# Patient Record
Sex: Female | Born: 1965 | Race: White | Hispanic: No | Smoking: Current every day smoker
Health system: Southern US, Community
[De-identification: ages and names within clinical notes are randomized; demographics above are authoritative.]

## PROBLEM LIST (undated history)

## (undated) DIAGNOSIS — J449 Chronic obstructive pulmonary disease, unspecified: Secondary | ICD-10-CM

## (undated) DIAGNOSIS — E079 Disorder of thyroid, unspecified: Secondary | ICD-10-CM

## (undated) DIAGNOSIS — J45909 Unspecified asthma, uncomplicated: Secondary | ICD-10-CM

## (undated) DIAGNOSIS — I10 Essential (primary) hypertension: Secondary | ICD-10-CM

## (undated) DIAGNOSIS — F319 Bipolar disorder, unspecified: Secondary | ICD-10-CM

## (undated) HISTORY — PX: TUBAL LIGATION: SHX77

---

## 2001-01-07 ENCOUNTER — Emergency Department (HOSPITAL_COMMUNITY): Admission: EM | Admit: 2001-01-07 | Discharge: 2001-01-07 | Payer: Self-pay | Admitting: Emergency Medicine

## 2001-01-07 ENCOUNTER — Encounter: Payer: Self-pay | Admitting: Emergency Medicine

## 2003-09-19 ENCOUNTER — Encounter: Payer: Self-pay | Admitting: Emergency Medicine

## 2003-09-19 ENCOUNTER — Inpatient Hospital Stay (HOSPITAL_COMMUNITY): Admission: EM | Admit: 2003-09-19 | Discharge: 2003-09-24 | Payer: Self-pay | Admitting: *Deleted

## 2003-12-15 ENCOUNTER — Emergency Department (HOSPITAL_COMMUNITY): Admission: EM | Admit: 2003-12-15 | Discharge: 2003-12-16 | Payer: Self-pay | Admitting: *Deleted

## 2004-12-03 ENCOUNTER — Emergency Department (HOSPITAL_COMMUNITY): Admission: EM | Admit: 2004-12-03 | Discharge: 2004-12-03 | Payer: Self-pay | Admitting: *Deleted

## 2005-02-25 ENCOUNTER — Emergency Department (HOSPITAL_COMMUNITY): Admission: EM | Admit: 2005-02-25 | Discharge: 2005-02-25 | Payer: Self-pay | Admitting: Emergency Medicine

## 2006-02-12 ENCOUNTER — Emergency Department (HOSPITAL_COMMUNITY): Admission: EM | Admit: 2006-02-12 | Discharge: 2006-02-12 | Payer: Self-pay | Admitting: Emergency Medicine

## 2006-07-25 ENCOUNTER — Emergency Department (HOSPITAL_COMMUNITY): Admission: EM | Admit: 2006-07-25 | Discharge: 2006-07-25 | Payer: Self-pay | Admitting: Emergency Medicine

## 2010-07-12 ENCOUNTER — Emergency Department (HOSPITAL_COMMUNITY): Payer: No Typology Code available for payment source

## 2010-07-12 ENCOUNTER — Encounter (HOSPITAL_COMMUNITY): Payer: Self-pay | Admitting: Radiology

## 2010-07-12 ENCOUNTER — Emergency Department (HOSPITAL_COMMUNITY)
Admission: EM | Admit: 2010-07-12 | Discharge: 2010-07-12 | Disposition: A | Discharge status: Home / Self Care | Payer: No Typology Code available for payment source | Attending: Emergency Medicine | Admitting: Emergency Medicine

## 2010-07-12 DIAGNOSIS — Y92410 Unspecified street and highway as the place of occurrence of the external cause: Secondary | ICD-10-CM | POA: Insufficient documentation

## 2010-07-12 DIAGNOSIS — S20219A Contusion of unspecified front wall of thorax, initial encounter: Secondary | ICD-10-CM | POA: Insufficient documentation

## 2010-07-12 DIAGNOSIS — S139XXA Sprain of joints and ligaments of unspecified parts of neck, initial encounter: Secondary | ICD-10-CM | POA: Insufficient documentation

## 2010-07-12 HISTORY — DX: Essential (primary) hypertension: I10

## 2010-07-24 ENCOUNTER — Emergency Department (HOSPITAL_COMMUNITY)
Admission: EM | Admit: 2010-07-24 | Discharge: 2010-07-24 | Disposition: A | Discharge status: Home / Self Care | Payer: No Typology Code available for payment source | Attending: Emergency Medicine | Admitting: Emergency Medicine

## 2010-07-24 DIAGNOSIS — J45909 Unspecified asthma, uncomplicated: Secondary | ICD-10-CM | POA: Insufficient documentation

## 2010-07-24 DIAGNOSIS — R05 Cough: Secondary | ICD-10-CM | POA: Insufficient documentation

## 2010-07-24 DIAGNOSIS — R059 Cough, unspecified: Secondary | ICD-10-CM | POA: Insufficient documentation

## 2010-08-09 ENCOUNTER — Emergency Department (HOSPITAL_COMMUNITY): Payer: No Typology Code available for payment source

## 2010-08-09 ENCOUNTER — Emergency Department (HOSPITAL_COMMUNITY)
Admission: EM | Admit: 2010-08-09 | Discharge: 2010-08-09 | Disposition: A | Discharge status: Home / Self Care | Payer: No Typology Code available for payment source | Attending: Emergency Medicine | Admitting: Emergency Medicine

## 2010-08-09 DIAGNOSIS — IMO0002 Reserved for concepts with insufficient information to code with codable children: Secondary | ICD-10-CM | POA: Insufficient documentation

## 2010-08-09 DIAGNOSIS — I10 Essential (primary) hypertension: Secondary | ICD-10-CM | POA: Insufficient documentation

## 2010-08-09 DIAGNOSIS — Z79899 Other long term (current) drug therapy: Secondary | ICD-10-CM | POA: Insufficient documentation

## 2010-08-09 DIAGNOSIS — S335XXA Sprain of ligaments of lumbar spine, initial encounter: Secondary | ICD-10-CM | POA: Insufficient documentation

## 2010-08-09 DIAGNOSIS — Y929 Unspecified place or not applicable: Secondary | ICD-10-CM | POA: Insufficient documentation

## 2010-08-09 DIAGNOSIS — R079 Chest pain, unspecified: Secondary | ICD-10-CM | POA: Insufficient documentation

## 2010-08-09 DIAGNOSIS — M542 Cervicalgia: Secondary | ICD-10-CM | POA: Insufficient documentation

## 2010-08-09 DIAGNOSIS — E039 Hypothyroidism, unspecified: Secondary | ICD-10-CM | POA: Insufficient documentation

## 2010-08-09 DIAGNOSIS — S139XXA Sprain of joints and ligaments of unspecified parts of neck, initial encounter: Secondary | ICD-10-CM | POA: Insufficient documentation

## 2010-09-01 ENCOUNTER — Other Ambulatory Visit (HOSPITAL_COMMUNITY): Payer: Self-pay | Admitting: Orthopaedic Surgery

## 2010-09-01 DIAGNOSIS — M545 Low back pain: Secondary | ICD-10-CM

## 2010-09-01 DIAGNOSIS — M546 Pain in thoracic spine: Secondary | ICD-10-CM

## 2010-09-06 ENCOUNTER — Ambulatory Visit (HOSPITAL_COMMUNITY)
Admission: RE | Admit: 2010-09-06 | Discharge: 2010-09-06 | Disposition: A | Discharge status: Home / Self Care | Payer: No Typology Code available for payment source | Source: Ambulatory Visit | Attending: Orthopaedic Surgery | Admitting: Orthopaedic Surgery

## 2010-09-06 DIAGNOSIS — M51379 Other intervertebral disc degeneration, lumbosacral region without mention of lumbar back pain or lower extremity pain: Secondary | ICD-10-CM | POA: Insufficient documentation

## 2010-09-06 DIAGNOSIS — M545 Low back pain, unspecified: Secondary | ICD-10-CM | POA: Insufficient documentation

## 2010-09-06 DIAGNOSIS — M546 Pain in thoracic spine: Secondary | ICD-10-CM

## 2010-09-06 DIAGNOSIS — S339XXA Sprain of unspecified parts of lumbar spine and pelvis, initial encounter: Secondary | ICD-10-CM

## 2010-09-06 DIAGNOSIS — M5137 Other intervertebral disc degeneration, lumbosacral region: Secondary | ICD-10-CM | POA: Insufficient documentation

## 2012-04-11 ENCOUNTER — Emergency Department (HOSPITAL_COMMUNITY)
Admission: EM | Admit: 2012-04-11 | Discharge: 2012-04-11 | Disposition: A | Discharge status: Home / Self Care | Payer: Medicaid Other | Attending: Emergency Medicine | Admitting: Emergency Medicine

## 2012-04-11 ENCOUNTER — Encounter (HOSPITAL_COMMUNITY): Payer: Self-pay | Admitting: *Deleted

## 2012-04-11 DIAGNOSIS — Z79899 Other long term (current) drug therapy: Secondary | ICD-10-CM | POA: Diagnosis not present

## 2012-04-11 DIAGNOSIS — F411 Generalized anxiety disorder: Secondary | ICD-10-CM | POA: Insufficient documentation

## 2012-04-11 DIAGNOSIS — F17200 Nicotine dependence, unspecified, uncomplicated: Secondary | ICD-10-CM | POA: Insufficient documentation

## 2012-04-11 DIAGNOSIS — F319 Bipolar disorder, unspecified: Secondary | ICD-10-CM | POA: Diagnosis not present

## 2012-04-11 DIAGNOSIS — I10 Essential (primary) hypertension: Secondary | ICD-10-CM | POA: Diagnosis not present

## 2012-04-11 DIAGNOSIS — F419 Anxiety disorder, unspecified: Secondary | ICD-10-CM

## 2012-04-11 HISTORY — DX: Bipolar disorder, unspecified: F31.9

## 2012-04-11 MED ORDER — LORAZEPAM 1 MG PO TABS: 1.0000 mg | ORAL_TABLET | Freq: Three times a day (TID) | ORAL | Status: DC | PRN

## 2012-04-11 MED ORDER — LORAZEPAM 2 MG/ML IJ SOLN
1.0000 mg | Freq: Once | INTRAMUSCULAR | Status: AC
  Administered 2012-04-11: 1 mg via INTRAVENOUS
  Filled 2012-04-11: qty 1

## 2012-04-11 NOTE — ED Provider Notes (Signed)
History     CSN: [id number]  Arrival date & time 04/11/12  1704   First MD Initiated Contact with Patient 04/11/12 1736      Chief Complaint  Patient presents with  . Medication Reaction    (Consider location/radiation/quality/duration/timing/severity/associated sxs/prior treatment) Patient is a 46 y.o. female presenting with anxiety. The history is provided by the patient (the pt complains of being axious since starting depakote). No language interpreter was used.  Anxiety This is a new problem. The current episode started more than 2 days ago. The problem occurs constantly. The problem has not changed since onset.Pertinent negatives include no chest pain, no abdominal pain and no headaches. Nothing aggravates the symptoms. Nothing relieves the symptoms.    Past Medical History  Diagnosis Date  . Hypertension   . Bipolar 1 disorder     Past Surgical History  Procedure Date  . Tubal ligation   . Cesarean section     No family history on file.  History  Substance Use Topics  . Smoking status: Current Every Day Smoker    Types: Cigarettes  . Smokeless tobacco: Not on file  . Alcohol Use: Yes     Comment: occ    OB History    Grav Para Term Preterm Abortions TAB SAB Ect Mult Living                  Review of Systems  Constitutional: Negative for fatigue.  HENT: Negative for congestion, sinus pressure and ear discharge.   Eyes: Negative for discharge.  Respiratory: Negative for cough.   Cardiovascular: Negative for chest pain.  Gastrointestinal: Negative for abdominal pain and diarrhea.  Genitourinary: Negative for frequency and hematuria.  Musculoskeletal: Negative for back pain.  Skin: Negative for rash.  Neurological: Negative for seizures and headaches.  Hematological: Negative.   Psychiatric/Behavioral: Positive for agitation. Negative for hallucinations.    Allergies  Penicillins and Sulfa antibiotics  Home Medications   Current Outpatient Rx    Name  Route  Sig  Dispense  Refill  . ALBUTEROL SULFATE HFA 108 (90 BASE) MCG/ACT IN AERS   Inhalation   Inhale 2 puffs into the lungs every 6 (six) hours as needed. For shortness of breath         . VITAMIN B-12 5000 MCG SL SUBL   Sublingual   Place 1 tablet under the tongue daily.         Marland Kitchen DIVALPROEX SODIUM 500 MG PO TBEC   Oral   Take 2,250 mg by mouth 2 (two) times daily.         Marland Kitchen LEVOTHYROXINE SODIUM 125 MCG PO TABS   Oral   Take 125 mcg by mouth daily.         Marland Kitchen LISINOPRIL-HYDROCHLOROTHIAZIDE 20-25 MG PO TABS   Oral   Take 1 tablet by mouth daily.         Marygrace Drought WOMENS FORMULA PO TABS   Oral   Take 1 tablet by mouth daily.         Marland Kitchen RANITIDINE HCL 150 MG PO TABS   Oral   Take 150 mg by mouth 2 (two) times daily.         Marland Kitchen LORAZEPAM 1 MG PO TABS   Oral   Take 1 tablet (1 mg total) by mouth 3 (three) times daily as needed for anxiety.   15 tablet   0     BP 141/69  Pulse 110  Temp 98.1 F (36.7  C)  Resp 20  Ht 5' (1.524 m)  Wt 120 lb (54.432 kg)  BMI 23.44 kg/m2  SpO2 100%  LMP 07/05/2010  Physical Exam  Constitutional: She is oriented to person, place, and time. She appears well-developed.  HENT:  Head: Normocephalic and atraumatic.  Eyes: Conjunctivae normal and EOM are normal. No scleral icterus.  Neck: Neck supple. No thyromegaly present.  Cardiovascular: Normal rate and regular rhythm.  Exam reveals no gallop and no friction rub.   No murmur heard. Pulmonary/Chest: No stridor. She has no wheezes. She has no rales. She exhibits no tenderness.  Abdominal: She exhibits no distension. There is no tenderness. There is no rebound.  Musculoskeletal: Normal range of motion. She exhibits no edema.  Lymphadenopathy:    She has no cervical adenopathy.  Neurological: She is oriented to person, place, and time. Coordination normal.  Skin: No rash noted. No erythema.  Psychiatric:       Pt anxious    ED Course  Procedures  (including critical care time)  Labs Reviewed - No data to display No results found.   1. Anxiety       MDM  The chart was scribed for me under my direct supervision.  I personally performed the history, physical, and medical decision making and all procedures in the evaluation of this patient.Benny Lennert, MD 04/11/12 2016

## 2012-04-11 NOTE — ED Notes (Signed)
Recently had prozac and depakote doses adjusted.  States, "I just can't slow down.  I feel like I'm about to lose it".  States feeling this way x 3 days.
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
## 2012-04-11 NOTE — ED Notes (Signed)
Pt denies any SI/HI thoughts at this time. 

## 2012-04-11 NOTE — ED Notes (Signed)
Pt states she has been drinking energy drinks

## 2012-04-13 ENCOUNTER — Encounter (HOSPITAL_COMMUNITY): Payer: Self-pay | Admitting: Emergency Medicine

## 2012-04-13 ENCOUNTER — Emergency Department (HOSPITAL_COMMUNITY)
Admission: EM | Admit: 2012-04-13 | Discharge: 2012-04-13 | Disposition: A | Discharge status: Home / Self Care | Payer: 59 | Attending: Emergency Medicine | Admitting: Emergency Medicine

## 2012-04-13 DIAGNOSIS — I10 Essential (primary) hypertension: Secondary | ICD-10-CM | POA: Diagnosis not present

## 2012-04-13 DIAGNOSIS — Z79899 Other long term (current) drug therapy: Secondary | ICD-10-CM | POA: Insufficient documentation

## 2012-04-13 DIAGNOSIS — F319 Bipolar disorder, unspecified: Secondary | ICD-10-CM | POA: Insufficient documentation

## 2012-04-13 DIAGNOSIS — F411 Generalized anxiety disorder: Secondary | ICD-10-CM | POA: Diagnosis present

## 2012-04-13 DIAGNOSIS — F419 Anxiety disorder, unspecified: Secondary | ICD-10-CM

## 2012-04-13 LAB — CBC
Platelets: 208 10*3/uL (ref 150–400)
RDW: 14.1 % (ref 11.5–15.5)
WBC: 6 10*3/uL (ref 4.0–10.5)

## 2012-04-13 LAB — RAPID URINE DRUG SCREEN, HOSP PERFORMED
Barbiturates: NOT DETECTED
Cocaine: NOT DETECTED
Opiates: NOT DETECTED

## 2012-04-13 LAB — BASIC METABOLIC PANEL
Chloride: 94 mEq/L — ABNORMAL LOW (ref 96–112)
GFR calc Af Amer: >90 mL/min (ref >90)
Potassium: 4.2 mEq/L (ref 3.5–5.1)
Sodium: 135 mEq/L (ref 135–145)

## 2012-04-13 LAB — URINALYSIS, ROUTINE W REFLEX MICROSCOPIC
Bilirubin Urine: NEGATIVE
Hgb urine dipstick: NEGATIVE
Nitrite: NEGATIVE
Specific Gravity, Urine: <1.005 — ABNORMAL LOW (ref 1.005–1.030)
pH: 6 (ref 5.0–8.0)

## 2012-04-13 LAB — PREGNANCY, URINE: Preg Test, Ur: NEGATIVE

## 2012-04-13 MED ORDER — LORAZEPAM 2 MG/ML IJ SOLN: 1.0000 mg | Freq: Once | INTRAMUSCULAR | Status: DC

## 2012-04-13 NOTE — ED Notes (Signed)
Act team will be contacted in AM, no ACT team member is on call at night at [facility]
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
## 2012-04-13 NOTE — ED Notes (Signed)
Spoke with Ella From ACT team, states she will see the patient this morning  
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
## 2012-04-13 NOTE — ED Notes (Signed)
Pt eval by ACT and is being discharged home
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
## 2012-06-05 ENCOUNTER — Inpatient Hospital Stay (HOSPITAL_COMMUNITY)
Admission: EM | Admit: 2012-06-05 | Discharge: 2012-06-06 | DRG: 192 | Payer: Medicaid Other | Attending: Internal Medicine | Admitting: Internal Medicine

## 2012-06-05 ENCOUNTER — Emergency Department (HOSPITAL_COMMUNITY): Payer: Medicaid Other

## 2012-06-05 ENCOUNTER — Encounter (HOSPITAL_COMMUNITY): Payer: Self-pay | Admitting: *Deleted

## 2012-06-05 DIAGNOSIS — G4700 Insomnia, unspecified: Secondary | ICD-10-CM | POA: Diagnosis present

## 2012-06-05 DIAGNOSIS — Z880 Allergy status to penicillin: Secondary | ICD-10-CM | POA: Diagnosis not present

## 2012-06-05 DIAGNOSIS — F411 Generalized anxiety disorder: Secondary | ICD-10-CM | POA: Diagnosis present

## 2012-06-05 DIAGNOSIS — I10 Essential (primary) hypertension: Secondary | ICD-10-CM | POA: Diagnosis present

## 2012-06-05 DIAGNOSIS — J441 Chronic obstructive pulmonary disease with (acute) exacerbation: Principal | ICD-10-CM | POA: Diagnosis present

## 2012-06-05 DIAGNOSIS — Z882 Allergy status to sulfonamides status: Secondary | ICD-10-CM | POA: Diagnosis not present

## 2012-06-05 DIAGNOSIS — J45901 Unspecified asthma with (acute) exacerbation: Secondary | ICD-10-CM | POA: Diagnosis present

## 2012-06-05 DIAGNOSIS — F17200 Nicotine dependence, unspecified, uncomplicated: Secondary | ICD-10-CM | POA: Diagnosis present

## 2012-06-05 DIAGNOSIS — Z720 Tobacco use: Secondary | ICD-10-CM

## 2012-06-05 DIAGNOSIS — R0602 Shortness of breath: Secondary | ICD-10-CM | POA: Diagnosis present

## 2012-06-05 DIAGNOSIS — Z79899 Other long term (current) drug therapy: Secondary | ICD-10-CM

## 2012-06-05 DIAGNOSIS — Z7982 Long term (current) use of aspirin: Secondary | ICD-10-CM

## 2012-06-05 DIAGNOSIS — F319 Bipolar disorder, unspecified: Secondary | ICD-10-CM | POA: Diagnosis present

## 2012-06-05 HISTORY — DX: Unspecified asthma, uncomplicated: J45.909

## 2012-06-05 HISTORY — DX: Chronic obstructive pulmonary disease, unspecified: J44.9

## 2012-06-05 LAB — BASIC METABOLIC PANEL
CO2: 28 mEq/L (ref 19–32)
Calcium: 9.7 mg/dL (ref 8.4–10.5)
GFR calc non Af Amer: >90 mL/min (ref >90)
Glucose, Bld: 113 mg/dL — ABNORMAL HIGH (ref 70–99)
Potassium: 4.9 mEq/L (ref 3.5–5.1)
Sodium: 137 mEq/L (ref 135–145)

## 2012-06-05 LAB — CBC WITH DIFFERENTIAL/PLATELET
Hemoglobin: 11.8 g/dL — ABNORMAL LOW (ref 12.0–15.0)
Lymphocytes Relative: 8 % — ABNORMAL LOW (ref 12–46)
Lymphs Abs: 0.8 10*3/uL (ref 0.7–4.0)
Monocytes Relative: 6 % (ref 3–12)
Neutrophils Relative %: 83 % — ABNORMAL HIGH (ref 43–77)
Platelets: 244 10*3/uL (ref 150–400)
RBC: 3.72 MIL/uL — ABNORMAL LOW (ref 3.87–5.11)
WBC: 9.8 10*3/uL (ref 4.0–10.5)

## 2012-06-05 LAB — RAPID STREP SCREEN (MED CTR MEBANE ONLY): Streptococcus, Group A Screen (Direct): NEGATIVE

## 2012-06-05 MED ORDER — QUETIAPINE FUMARATE ER 50 MG PO TB24
150.0000 mg | ORAL_TABLET | Freq: Every day | ORAL | Status: DC
  Administered 2012-06-06: 150 mg via ORAL
  Filled 2012-06-05 (×2): qty 3

## 2012-06-05 MED ORDER — ALBUTEROL SULFATE (5 MG/ML) 0.5% IN NEBU
15.0000 mg | INHALATION_SOLUTION | Freq: Once | RESPIRATORY_TRACT | Status: AC
  Administered 2012-06-05: 15 mg via RESPIRATORY_TRACT
  Filled 2012-06-05 (×2): qty 3

## 2012-06-05 MED ORDER — ACETAMINOPHEN 325 MG PO TABS: 650.0000 mg | ORAL_TABLET | Freq: Four times a day (QID) | ORAL | Status: DC | PRN

## 2012-06-05 MED ORDER — LEVOTHYROXINE SODIUM 25 MCG PO TABS: 125.0000 ug | ORAL_TABLET | Freq: Every day | ORAL | Status: DC

## 2012-06-05 MED ORDER — ALBUTEROL SULFATE (5 MG/ML) 0.5% IN NEBU: 2.5000 mg | INHALATION_SOLUTION | RESPIRATORY_TRACT | Status: DC | PRN

## 2012-06-05 MED ORDER — IPRATROPIUM BROMIDE 0.02 % IN SOLN
1.0000 mg | Freq: Once | RESPIRATORY_TRACT | Status: AC
  Administered 2012-06-05: 1 mg via RESPIRATORY_TRACT
  Filled 2012-06-05: qty 5

## 2012-06-05 MED ORDER — ACETAMINOPHEN 650 MG RE SUPP: 650.0000 mg | Freq: Four times a day (QID) | RECTAL | Status: DC | PRN

## 2012-06-05 MED ORDER — ALBUTEROL SULFATE (5 MG/ML) 0.5% IN NEBU
2.5000 mg | INHALATION_SOLUTION | Freq: Four times a day (QID) | RESPIRATORY_TRACT | Status: DC
  Administered 2012-06-06 (×2): 2.5 mg via RESPIRATORY_TRACT
  Filled 2012-06-05 (×2): qty 0.5

## 2012-06-05 MED ORDER — TRAZODONE HCL 50 MG PO TABS: 25.0000 mg | ORAL_TABLET | Freq: Every evening | ORAL | Status: DC | PRN

## 2012-06-05 MED ORDER — LEVOFLOXACIN IN D5W 500 MG/100ML IV SOLN
500.0000 mg | Freq: Once | INTRAVENOUS | Status: AC
  Administered 2012-06-05: 500 mg via INTRAVENOUS
  Filled 2012-06-05: qty 100

## 2012-06-05 MED ORDER — LEVOFLOXACIN IN D5W 500 MG/100ML IV SOLN
500.0000 mg | INTRAVENOUS | Status: DC
  Filled 2012-06-05: qty 100

## 2012-06-05 MED ORDER — METHYLPREDNISOLONE SODIUM SUCC 125 MG IJ SOLR
60.0000 mg | Freq: Two times a day (BID) | INTRAMUSCULAR | Status: DC
  Administered 2012-06-06 (×2): 60 mg via INTRAVENOUS
  Filled 2012-06-05 (×2): qty 2

## 2012-06-05 MED ORDER — ALUM & MAG HYDROXIDE-SIMETH 200-200-20 MG/5ML PO SUSP
30.0000 mL | Freq: Four times a day (QID) | ORAL | Status: DC | PRN
Start: 2012-06-05 — End: 2012-06-06

## 2012-06-05 MED ORDER — ONDANSETRON HCL 4 MG/2ML IJ SOLN: 4.0000 mg | Freq: Four times a day (QID) | INTRAMUSCULAR | Status: DC | PRN

## 2012-06-05 MED ORDER — GABAPENTIN 300 MG PO CAPS
300.0000 mg | ORAL_CAPSULE | Freq: Three times a day (TID) | ORAL | Status: DC
  Administered 2012-06-06 (×2): 300 mg via ORAL
  Filled 2012-06-05 (×2): qty 1

## 2012-06-05 MED ORDER — SODIUM CHLORIDE 0.9 % IV SOLN: INTRAVENOUS | Status: DC

## 2012-06-05 MED ORDER — PREDNISONE 50 MG PO TABS
60.0000 mg | ORAL_TABLET | Freq: Once | ORAL | Status: AC
  Administered 2012-06-05: 60 mg via ORAL
  Filled 2012-06-05: qty 1

## 2012-06-05 MED ORDER — ONDANSETRON HCL 4 MG PO TABS: 4.0000 mg | ORAL_TABLET | Freq: Four times a day (QID) | ORAL | Status: DC | PRN

## 2012-06-05 MED ORDER — IPRATROPIUM BROMIDE 0.02 % IN SOLN
0.5000 mg | Freq: Four times a day (QID) | RESPIRATORY_TRACT | Status: DC
  Administered 2012-06-06 (×2): 0.5 mg via RESPIRATORY_TRACT
  Filled 2012-06-05 (×2): qty 2.5

## 2012-06-05 MED ORDER — HYPROMELLOSE (GONIOSCOPIC) 2.5 % OP SOLN
1.0000 [drp] | Freq: Three times a day (TID) | OPHTHALMIC | Status: DC | PRN
  Filled 2012-06-05: qty 15

## 2012-06-05 MED ORDER — SODIUM CHLORIDE 0.9 % IV SOLN
INTRAVENOUS | Status: DC
  Administered 2012-06-05: 23:00:00 via INTRAVENOUS

## 2012-06-05 MED ORDER — GUAIFENESIN-DM 100-10 MG/5ML PO SYRP: 5.0000 mL | ORAL_SOLUTION | ORAL | Status: DC | PRN

## 2012-06-05 MED ORDER — LISINOPRIL 10 MG PO TABS
10.0000 mg | ORAL_TABLET | Freq: Every day | ORAL | Status: DC
  Administered 2012-06-06: 10 mg via ORAL
  Filled 2012-06-05: qty 1

## 2012-06-05 NOTE — ED Notes (Signed)
O2 sats increased to 96

## 2012-06-05 NOTE — ED Notes (Signed)
Oxygen sats will not stay above 90 on room air. [place] 2 LPM placed. EDP notified. BBS course with wheezing throughout.  Pt has a productive cough of yellow/green.

## 2012-06-05 NOTE — ED Provider Notes (Signed)
History     CSN: [id number]  Arrival date & time 06/05/12  Margaret Sheppard   First MD Initiated Contact with Patient 06/05/12 2023      Chief Complaint  Patient presents with  . Shortness of Breath     HPI Pt was seen at 2040.   Per pt, c/o gradual onset and worsening of persistence SOB, moist cough, and "wheezing" for the past 4 days.  Has been associated with runny/stuffy nose, sore throat, generalized body aches and fatigue.  Describes her cough as productive of "yellow" sputum. States her symptoms began after she ran out of her MDI 4 days ago.  Denies CP/palpitations, no back pain, no abd pain, no N/V/D, no fevers.     PMD:  Health Dept Past Medical History  Diagnosis Date  . Hypertension   . Bipolar 1 disorder   . Asthma   . COPD (chronic obstructive pulmonary disease)     Past Surgical History  Procedure Date  . Tubal ligation   . Cesarean section     History  Substance Use Topics  . Smoking status: Current Every Day Smoker    Types: Cigarettes  . Smokeless tobacco: Not on file  . Alcohol Use: No     Comment: occ    Review of Systems ROS: Statement: All systems negative except as marked or noted in the HPI; Constitutional: Negative for fever and chills. ; ; Eyes: Negative for eye pain, redness and discharge. ; ; ENMT: Negative for ear pain, hoarseness, +nasal congestion, sinus pressure and sore throat. ; ; Cardiovascular: Negative for chest pain, palpitations, diaphoresis, and peripheral edema. ; ; Respiratory: +cough, wheezing, SOB. Negative for stridor. ; ; Gastrointestinal: Negative for nausea, vomiting, diarrhea, abdominal pain, blood in stool, hematemesis, jaundice and rectal bleeding. . ; ; Genitourinary: Negative for dysuria, flank pain and hematuria. ; ; Musculoskeletal: Negative for back pain and neck pain. Negative for swelling and trauma.; ; Skin: Negative for pruritus, rash, abrasions, blisters, bruising and skin lesion.; ; Neuro: Negative for headache,  lightheadedness and neck stiffness. Negative for weakness, altered level of consciousness , altered mental status, extremity weakness, paresthesias, involuntary movement, seizure and syncope.       Allergies  Sulfa antibiotics and Penicillins  Home Medications   Current Outpatient Rx  Name  Route  Sig  Dispense  Refill  . ALBUTEROL SULFATE HFA 108 (90 BASE) MCG/ACT IN AERS   Inhalation   Inhale 2 puffs into the lungs every 6 (six) hours as needed. For shortness of breath         . ASPIRIN 325 MG PO TABS   Oral   Take 650 mg by mouth once as needed. For fever         . ASPIRIN-SALICYLAMIDE-CAFFEINE 325-95-16 MG PO TABS   Oral   Take 1 packet by mouth once as needed. For fever         . VITAMIN B-12 5000 MCG SL SUBL   Sublingual   Place 1 tablet under the tongue daily.         Marland Kitchen GABAPENTIN 300 MG PO CAPS   Oral   Take 300 mg by mouth 3 (three) times daily as needed.         Marland Kitchen HYPROMELLOSE 2.5 % OP SOLN   Both Eyes   Place 1 drop into both eyes 3 (three) times daily as needed. For dry eyes/contacts         . LEVOTHYROXINE SODIUM 125 MCG PO TABS  Oral   Take 125 mcg by mouth daily.         Marland Kitchen LISINOPRIL 10 MG PO TABS   Oral   Take 10 mg by mouth daily.         Marland Kitchen PSEUDOEPHEDRINE-GUAIFENESIN ER 60-600 MG PO TB12   Oral   Take 2 tablets by mouth daily as needed. For congestion         . QUETIAPINE FUMARATE ER 150 MG PO TB24   Oral   Take 150 mg by mouth at bedtime.           BP 103/60  Pulse 104  Temp 97.9 F (36.6 C) (Oral)  Resp 24  Ht 5' (1.524 m)  Wt 118 lb (53.524 kg)  BMI 23.05 kg/m2  SpO2 92%  LMP 07/05/2010  Physical Exam 2045: Physical examination:  Nursing notes reviewed; Vital signs and O2 SAT reviewed;  Constitutional: Well developed, Well nourished, Well hydrated, Uncomfortable appearing; Head:  Normocephalic, atraumatic; Eyes: EOMI, PERRL, No scleral icterus; ENMT: Mouth and pharynx normal, Mucous membranes moist; Neck:  Supple, Full range of motion, No lymphadenopathy; Cardiovascular: Tachycardic rate and rhythm, No gallop; Respiratory: Breath sounds diminished & equal bilaterally, faint scattered wheezes.  Speaking words, sitting upright, +access mm use. Tachypneic.; Chest: Nontender, Movement normal; Abdomen: Soft, Nontender, Nondistended, Normal bowel sounds; Genitourinary: No CVA tenderness; Extremities: Pulses normal, No tenderness, No edema, No calf edema or asymmetry.; Neuro: AA&Ox3, Major CN grossly intact.  Speech clear. No gross focal motor or sensory deficits in extremities.; Skin: Color normal, Warm, Dry.   ED Course  Procedures     MDM  MDM Reviewed: previous chart, nursing note and vitals Interpretation: x-ray and labs Total time providing critical care: 30-74 minutes. This excludes time spent performing separately reportable procedures and services. Consults: admitting MD   CRITICAL CARE Performed by: Laray Anger Total critical care time: 40 Critical care time was exclusive of separately billable procedures and treating other patients. Critical care was necessary to treat or prevent imminent or life-threatening deterioration. Critical care was time spent personally by me on the following activities: development of treatment plan with patient and/or surrogate as well as nursing, discussions with consultants, evaluation of patient's response to treatment, examination of patient, obtaining history from patient or surrogate, ordering and performing treatments and interventions, ordering and review of laboratory studies, ordering and review of radiographic studies, pulse oximetry and re-evaluation of patient's condition.    Results for orders placed during the hospital encounter of 06/05/12  RAPID STREP SCREEN      Component Value Range   Streptococcus, Group A Screen (Direct) NEGATIVE  NEGATIVE   Dg Chest 2 View 06/05/2012  *RADIOLOGY REPORT*  Clinical Data: Shortness of breath.  Cough  and fever.  CHEST - 2 VIEW  Comparison: None available.  Findings: The heart size is normal.  The lungs are clear.  Mild emphysematous changes are noted.  The visualized soft tissues and bony thorax are unremarkable.  IMPRESSION: 1.  Mild emphysematous change. 2.  No acute cardiopulmonary disease.   Original Report Authenticated By: Marin Roberts, M.D.      2230:  Pt completed hour long neb, steroids given.  Pt continues to c/o SOB but feels somewhat improved from arrival to ED, Sats 88-89% R/A. Lungs with insp/exp wheezing bilat, no audible wheezing. Speaking in short sentences. Able to lean back on stretcher now.  Sats increase to 96% when O2 N/C applied. Will dose IV levaquin for acute exacerbation COPD and admit.  Dx and testing d/w pt.  Questions answered.  Verb understanding, agreeable to admit.  2240:  T/C to [facility]. Phillips Odor, case discussed, including:  HPI, pertinent PM/SHx, VS/PE, dx testing, ED course and treatment:  Agreeable to admit, requests she will come to ED for eval.        Laray Anger, DO 06/06/12 1719

## 2012-06-05 NOTE — ED Notes (Signed)
Cough, sore throat, sob, yellow green sputum.  Fever.  No vomiting.

## 2012-06-05 NOTE — ED Notes (Signed)
Pt presents with SOB,sore throat, cough and nasal congestion x 4 days. Pt reports extensive history of COPD, emphysemia and asthma. No albuterol use at home secondary to ran out of medication.

## 2012-06-06 DIAGNOSIS — I10 Essential (primary) hypertension: Secondary | ICD-10-CM

## 2012-06-06 DIAGNOSIS — F319 Bipolar disorder, unspecified: Secondary | ICD-10-CM

## 2012-06-06 DIAGNOSIS — F17200 Nicotine dependence, unspecified, uncomplicated: Secondary | ICD-10-CM

## 2012-06-06 MED ORDER — PREDNISONE 20 MG PO TABS: ORAL_TABLET | ORAL | Status: DC

## 2012-06-06 MED ORDER — LEVOFLOXACIN 500 MG PO TABS: 500.0000 mg | ORAL_TABLET | Freq: Every day | ORAL | Status: AC

## 2012-06-06 MED ORDER — LEVOTHYROXINE SODIUM 112 MCG PO TABS: 112.0000 ug | ORAL_TABLET | Freq: Every day | ORAL | Status: DC

## 2012-06-06 MED ORDER — POLYVINYL ALCOHOL 1.4 % OP SOLN: 1.0000 [drp] | Freq: Three times a day (TID) | OPHTHALMIC | Status: DC | PRN

## 2012-06-06 MED ORDER — PNEUMOCOCCAL VAC POLYVALENT 25 MCG/0.5ML IJ INJ
0.5000 mL | INJECTION | INTRAMUSCULAR | Status: DC
  Filled 2012-06-06: qty 0.5

## 2012-06-06 MED ORDER — INFLUENZA VIRUS VACC SPLIT PF IM SUSP
0.5000 mL | INTRAMUSCULAR | Status: DC
  Filled 2012-06-06: qty 0.5
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
## 2012-06-06 NOTE — Care Management Note (Signed)
    Page 1 of 1   06/06/2012     3:17:33 PM   CARE MANAGEMENT NOTE 06/06/2012  Patient:  Margaret Sheppard, Margaret Sheppard   Account Number:  [id number]  Date Initiated:  06/06/2012  Documentation initiated by:  Rosemary Holms  Subjective/Objective Assessment:   [facility]. DC'd back to same. MATCH program done.     Action/Plan:   Anticipated DC Date:  06/06/2012   Anticipated DC Plan:  HOME/SELF CARE      DC Planning Services  CM consult  MATCH Program      Choice offered to / List presented to:             Status of service:  Completed, signed off Medicare Important Message given?   (If response is "NO", the following Medicare IM given date fields will be blank) Date Medicare IM given:   Date Additional Medicare IM given:    Discharge Disposition:  HOME/SELF CARE  Per UR Regulation:    If discussed at Long Length of Stay Meetings, dates discussed:    Comments:  06/06/12 Rosemary Holms RN BSN CM

## 2012-06-06 NOTE — Progress Notes (Signed)
UR Chart Review Completed  

## 2012-06-06 NOTE — H&P (Signed)
[facility] History and Physical  Margaret Sheppard ZOX:[id number] DOB: 10-Apr-1966 DOA: 06/05/2012  Referring physician: Weber Cooks PCP: No primary provider on file.  Specialists: none  Chief Complaint: Dyspnea  HPI: Margaret Sheppard is a 47 y.o. female a past medical history significant for Asthma/COPD and ongoing tobacco abuse who presented to the emergency department with a 2 to 3 day history of increasing shortness of breath associated with a sore throat, nasal congestion and cough. No fevers, she has had myalgias. She does not currently have a primary care provider or health insurance. She ran out of her albuterol inhaler which she had been getting from the health department. On arrival to the emergency department her oxygen saturations were in the mid 80s, these improved to the mid 90s with continuous inhaled albuterol as well as supplemental nasal cannula oxygen her chest x-ray did not show a new infiltrate but showed chronic emphysematous changes. She continued in the emergency department to be extremely dyspneic and was using accessory muscles to breathe therefore the decision was made to admit her to the hospital for stabilization of her COPD exacerbation.  Review of Systems: Review of Systems  Constitutional: Positive for fever, chills and malaise/fatigue.  HENT: Positive for congestion and sore throat.   Respiratory: Positive for cough, sputum production, shortness of breath and wheezing.   Cardiovascular: Negative.   Gastrointestinal: Negative.   Genitourinary: Negative.   Musculoskeletal: Positive for myalgias.  Neurological: Positive for dizziness, weakness and headaches.  Endo/Heme/Allergies: Negative.   Psychiatric/Behavioral: Positive for depression. Negative for suicidal ideas and substance abuse. The patient is nervous/anxious and has insomnia.   All other systems reviewed and are negative.     Past Medical History  Diagnosis Date  . Hypertension   . Bipolar 1  disorder   . Asthma   . COPD (chronic obstructive pulmonary disease)    Past Surgical History  Procedure Date  . Tubal ligation   . Cesarean section    Social History:  reports that she has been smoking Cigarettes.  She does not have any smokeless tobacco history on file. She reports that she does not drink alcohol or use illicit drugs. Home, independent  Allergies  Allergen Reactions  . Lithium Other (See Comments)    Pt states it makes her kidney's hurt  . Sulfa Antibiotics Hives  . Penicillins Hives, Nausea And Vomiting and Rash    History reviewed. No pertinent family history. Positive for HTN, Lung Disease and CAD.  Prior to Admission medications   Medication Sig Start Date End Date Taking? Authorizing Provider  albuterol (PROVENTIL HFA) 108 (90 BASE) MCG/ACT inhaler Inhale 2 puffs into the lungs every 6 (six) hours as needed. For shortness of breath   Yes Historical Provider, MD  aspirin 325 MG tablet Take 650 mg by mouth once as needed. For fever   Yes Historical Provider, MD  Aspirin-Salicylamide-Caffeine (BC HEADACHE) 325-95-16 MG TABS Take 1 packet by mouth once as needed. For fever   Yes Historical Provider, MD  Cyanocobalamin (VITAMIN B-12) 5000 MCG SUBL Place 1 tablet under the tongue daily.   Yes Historical Provider, MD  gabapentin (NEURONTIN) 300 MG capsule Take 300 mg by mouth 3 (three) times daily as needed.   Yes Historical Provider, MD  hydroxypropyl methylcellulose (ISOPTO TEARS) 2.5 % ophthalmic solution Place 1 drop into both eyes 3 (three) times daily as needed. For dry eyes/contacts   Yes Historical Provider, MD  levothyroxine (SYNTHROID, LEVOTHROID) 125 MCG tablet Take 125 mcg  by mouth daily.   Yes Historical Provider, MD  lisinopril (PRINIVIL,ZESTRIL) 10 MG tablet Take 10 mg by mouth daily.   Yes Historical Provider, MD  pseudoephedrine-guaifenesin (MUCINEX D) 60-600 MG per tablet Take 2 tablets by mouth daily as needed. For congestion   Yes Historical  Provider, MD  QUEtiapine Fumarate (SEROQUEL XR) 150 MG 24 hr tablet Take 150 mg by mouth at bedtime.   Yes Historical Provider, MD   Physical Exam: Filed Vitals:   06/05/12 2300 06/05/12 2319 06/05/12 2349 06/06/12 0246  BP:  113/63 116/63   Pulse: 104 98 100   Temp:  97.9 F (36.6 C) 97.7 F (36.5 C)   TempSrc:  Oral Oral   Resp: 18 22 22    Height:   5' (1.524 m)   Weight:   55.384 kg (122 lb 1.6 oz)   SpO2: 98% 99% 98% 97%     General:  Alert and oriented in mild distress, pleasant and cooperative, able to speak in full sentences  Eyes: Normal  ENT: Normal  Neck: Normal  Cardiovascular: Tachycardic, regular no murmurs rubs or gallop  Respiratory: Diffuse wheezing but improved air movement  Abdomen: Soft nontender  Skin: Rashes or edema  Musculoskeletal: Normal  Psychiatric: Appropriate  Neurologic: Nonfocal  Labs on Admission:  Basic Metabolic Panel:  Lab 06/05/12 3664  NA 137  K 4.9  CL 100  CO2 28  GLUCOSE 113*  BUN 18  CREATININE 0.49*  CALCIUM 9.7  MG --  PHOS --   CBC:  Lab 06/05/12 2231  WBC 9.8  NEUTROABS 8.1*  HGB 11.8*  HCT 35.4*  MCV 95.2  PLT 244   CBG: No results found for this basename: GLUCAP:5 in the last 168 hours  Radiological Exams on Admission: Dg Chest 2 View  06/05/2012  *RADIOLOGY REPORT*  Clinical Data: Shortness of breath.  Cough and fever.  CHEST - 2 VIEW  Comparison: None available.  Findings: The heart size is normal.  The lungs are clear.  Mild emphysematous changes are noted.  The visualized soft tissues and bony thorax are unremarkable.  IMPRESSION: 1.  Mild emphysematous change. 2.  No acute cardiopulmonary disease.   Original Report Authenticated By: Marin Roberts, M.D.     EKG: Independently reviewed. Sinus Tachy Assessment/Plan Active Problems:  COPD exacerbation  Tobacco abuse  HTN (hypertension)  Bipolar 1 disorder  Patient is being admitted for a COPD exacerbation. She has ongoing tobacco  abuse. She has limited resources including difficulty obtaining her respiratory medications. He is also never had any pulmonary function tests quantify the severity of her disease. Most likely situation and for this admission is a viral URI and concomitant COPD exacerbation.   Support with nasal cannula oxygen, Solu-Medrol,  Empiric antibiotics, nebulized albuterol and Atrovent.  COPD protocol orders placed  Patient will need education and also assistance with obtaining appropriate home inhalers  Tobacco cessation counseling  Resume her bipolar medications and antihypertensives     Code Status: Full Code Family Communication: Discussed with patient  Estimated LOS 1-2 days, d/c home when medically stable.  Time spent: 50 minutes  [facility] Pager [phone]   If 7PM-7AM, please contact night-coverage [email or web address] Password [facility] 06/06/2012, 4:49 AM

## 2012-06-06 NOTE — Progress Notes (Signed)
Discharged to home. Voucher given for medication. VSS. Transported to front by wheelchair to private vehicle. Discharge instructions given. Voiced understanding
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
## 2013-04-28 ENCOUNTER — Emergency Department (HOSPITAL_COMMUNITY): Payer: Medicaid Other

## 2013-04-28 ENCOUNTER — Inpatient Hospital Stay (HOSPITAL_COMMUNITY)
Admission: EM | Admit: 2013-04-28 | Discharge: 2013-05-02 | DRG: 190 | Disposition: A | Discharge status: Home / Self Care | Payer: Medicaid Other | Attending: Family Medicine | Admitting: Family Medicine

## 2013-04-28 ENCOUNTER — Encounter (HOSPITAL_COMMUNITY): Payer: Self-pay | Admitting: Emergency Medicine

## 2013-04-28 DIAGNOSIS — F17200 Nicotine dependence, unspecified, uncomplicated: Secondary | ICD-10-CM | POA: Diagnosis present

## 2013-04-28 DIAGNOSIS — Z23 Encounter for immunization: Secondary | ICD-10-CM

## 2013-04-28 DIAGNOSIS — I10 Essential (primary) hypertension: Secondary | ICD-10-CM | POA: Diagnosis present

## 2013-04-28 DIAGNOSIS — J9600 Acute respiratory failure, unspecified whether with hypoxia or hypercapnia: Secondary | ICD-10-CM | POA: Diagnosis present

## 2013-04-28 DIAGNOSIS — J9601 Acute respiratory failure with hypoxia: Secondary | ICD-10-CM

## 2013-04-28 DIAGNOSIS — Z720 Tobacco use: Secondary | ICD-10-CM

## 2013-04-28 DIAGNOSIS — E039 Hypothyroidism, unspecified: Secondary | ICD-10-CM | POA: Diagnosis present

## 2013-04-28 DIAGNOSIS — F319 Bipolar disorder, unspecified: Secondary | ICD-10-CM

## 2013-04-28 DIAGNOSIS — Z8249 Family history of ischemic heart disease and other diseases of the circulatory system: Secondary | ICD-10-CM

## 2013-04-28 DIAGNOSIS — J449 Chronic obstructive pulmonary disease, unspecified: Secondary | ICD-10-CM | POA: Diagnosis present

## 2013-04-28 DIAGNOSIS — R0602 Shortness of breath: Secondary | ICD-10-CM | POA: Diagnosis not present

## 2013-04-28 DIAGNOSIS — J441 Chronic obstructive pulmonary disease with (acute) exacerbation: Secondary | ICD-10-CM | POA: Diagnosis not present

## 2013-04-28 DIAGNOSIS — J9621 Acute and chronic respiratory failure with hypoxia: Secondary | ICD-10-CM | POA: Diagnosis present

## 2013-04-28 LAB — BASIC METABOLIC PANEL
BUN: 10 mg/dL (ref 6–23)
Calcium: 9.2 mg/dL (ref 8.4–10.5)
Creatinine, Ser: 0.68 mg/dL (ref 0.50–1.10)
GFR calc Af Amer: >90 mL/min (ref >90)
GFR calc non Af Amer: >90 mL/min (ref >90)
Potassium: 3.9 mEq/L (ref 3.5–5.1)

## 2013-04-28 LAB — CBC WITH DIFFERENTIAL/PLATELET
Basophils Absolute: 0.1 10*3/uL (ref 0.0–0.1)
Basophils Relative: 1 % (ref 0–1)
MCHC: 31.4 g/dL (ref 30.0–36.0)
Neutro Abs: 9.7 10*3/uL — ABNORMAL HIGH (ref 1.7–7.7)
Neutrophils Relative %: 81 % — ABNORMAL HIGH (ref 43–77)
Platelets: 302 10*3/uL (ref 150–400)
RDW: 15.2 % (ref 11.5–15.5)

## 2013-04-28 MED ORDER — ENOXAPARIN SODIUM 40 MG/0.4ML ~~LOC~~ SOLN
40.0000 mg | SUBCUTANEOUS | Status: DC
  Administered 2013-04-28 – 2013-05-01 (×4): 40 mg via SUBCUTANEOUS
  Filled 2013-04-28 (×4): qty 0.4

## 2013-04-28 MED ORDER — ARIPIPRAZOLE 10 MG PO TABS
5.0000 mg | ORAL_TABLET | Freq: Every day | ORAL | Status: DC
  Administered 2013-04-29 – 2013-05-02 (×4): 5 mg via ORAL
  Filled 2013-04-28 (×4): qty 1

## 2013-04-28 MED ORDER — DEXTROSE 5 % IV SOLN
500.0000 mg | Freq: Once | INTRAVENOUS | Status: AC
  Administered 2013-04-28: 500 mg via INTRAVENOUS

## 2013-04-28 MED ORDER — NICOTINE 14 MG/24HR TD PT24
14.0000 mg | MEDICATED_PATCH | Freq: Every day | TRANSDERMAL | Status: DC
  Administered 2013-04-28 – 2013-05-02 (×5): 14 mg via TRANSDERMAL
  Filled 2013-04-28 (×5): qty 1

## 2013-04-28 MED ORDER — ACETAMINOPHEN 650 MG RE SUPP: 650.0000 mg | Freq: Four times a day (QID) | RECTAL | Status: DC | PRN

## 2013-04-28 MED ORDER — LEVOFLOXACIN IN D5W 500 MG/100ML IV SOLN
500.0000 mg | INTRAVENOUS | Status: DC
  Administered 2013-04-29 (×2): 500 mg via INTRAVENOUS
  Filled 2013-04-28 (×3): qty 100

## 2013-04-28 MED ORDER — SODIUM CHLORIDE 0.9 % IV SOLN: 250.0000 mL | INTRAVENOUS | Status: DC | PRN

## 2013-04-28 MED ORDER — LEVALBUTEROL HCL 0.63 MG/3ML IN NEBU: 0.6300 mg | INHALATION_SOLUTION | RESPIRATORY_TRACT | Status: DC | PRN

## 2013-04-28 MED ORDER — IPRATROPIUM BROMIDE 0.02 % IN SOLN
0.5000 mg | Freq: Once | RESPIRATORY_TRACT | Status: AC
  Administered 2013-04-28: 0.5 mg via RESPIRATORY_TRACT
  Filled 2013-04-28: qty 2.5

## 2013-04-28 MED ORDER — LEVALBUTEROL HCL 0.63 MG/3ML IN NEBU
0.6300 mg | INHALATION_SOLUTION | RESPIRATORY_TRACT | Status: DC
  Administered 2013-04-28 – 2013-05-02 (×20): 0.63 mg via RESPIRATORY_TRACT
  Filled 2013-04-28 (×21): qty 3

## 2013-04-28 MED ORDER — SODIUM CHLORIDE 0.9 % IJ SOLN: 3.0000 mL | INTRAMUSCULAR | Status: DC | PRN

## 2013-04-28 MED ORDER — GUAIFENESIN ER 600 MG PO TB12
600.0000 mg | ORAL_TABLET | Freq: Two times a day (BID) | ORAL | Status: DC
  Administered 2013-04-28 – 2013-05-02 (×8): 600 mg via ORAL
  Filled 2013-04-28 (×8): qty 1

## 2013-04-28 MED ORDER — ONDANSETRON HCL 4 MG/2ML IJ SOLN: 4.0000 mg | Freq: Four times a day (QID) | INTRAMUSCULAR | Status: DC | PRN

## 2013-04-28 MED ORDER — INFLUENZA VAC SPLIT QUAD 0.5 ML IM SUSP
0.5000 mL | INTRAMUSCULAR | Status: AC
  Administered 2013-04-30: 0.5 mL via INTRAMUSCULAR
  Filled 2013-04-28: qty 0.5

## 2013-04-28 MED ORDER — ACETAMINOPHEN 325 MG PO TABS: 650.0000 mg | ORAL_TABLET | Freq: Four times a day (QID) | ORAL | Status: DC | PRN

## 2013-04-28 MED ORDER — LEVOFLOXACIN IN D5W 500 MG/100ML IV SOLN
INTRAVENOUS | Status: AC
  Filled 2013-04-28: qty 100

## 2013-04-28 MED ORDER — GABAPENTIN 300 MG PO CAPS
300.0000 mg | ORAL_CAPSULE | Freq: Three times a day (TID) | ORAL | Status: DC
  Administered 2013-04-28 – 2013-05-02 (×11): 300 mg via ORAL
  Filled 2013-04-28: qty 1
  Filled 2013-04-28: qty 3
  Filled 2013-04-28 (×9): qty 1

## 2013-04-28 MED ORDER — METHYLPREDNISOLONE SODIUM SUCC 125 MG IJ SOLR
60.0000 mg | Freq: Four times a day (QID) | INTRAMUSCULAR | Status: DC
  Administered 2013-04-28 – 2013-04-29 (×3): 60 mg via INTRAVENOUS
  Filled 2013-04-28 (×2): qty 2

## 2013-04-28 MED ORDER — ALBUTEROL (5 MG/ML) CONTINUOUS INHALATION SOLN
10.0000 mg/h | INHALATION_SOLUTION | Freq: Once | RESPIRATORY_TRACT | Status: AC
  Administered 2013-04-28: 10 mg/h via RESPIRATORY_TRACT
  Filled 2013-04-28: qty 20

## 2013-04-28 MED ORDER — ALBUTEROL SULFATE (5 MG/ML) 0.5% IN NEBU
5.0000 mg | INHALATION_SOLUTION | Freq: Once | RESPIRATORY_TRACT | Status: AC
  Administered 2013-04-28: 5 mg via RESPIRATORY_TRACT
  Filled 2013-04-28: qty 1

## 2013-04-28 MED ORDER — LEVOTHYROXINE SODIUM 25 MCG PO TABS
125.0000 ug | ORAL_TABLET | Freq: Every day | ORAL | Status: DC
  Administered 2013-04-29 – 2013-05-02 (×4): 125 ug via ORAL
  Filled 2013-04-28 (×8): qty 1

## 2013-04-28 MED ORDER — PREDNISONE 50 MG PO TABS
60.0000 mg | ORAL_TABLET | Freq: Once | ORAL | Status: AC
  Administered 2013-04-28: 60 mg via ORAL
  Filled 2013-04-28 (×2): qty 1

## 2013-04-28 MED ORDER — SODIUM CHLORIDE 0.9 % IJ SOLN
3.0000 mL | Freq: Two times a day (BID) | INTRAMUSCULAR | Status: DC
  Administered 2013-04-28 – 2013-05-02 (×5): 3 mL via INTRAVENOUS

## 2013-04-28 MED ORDER — ONDANSETRON HCL 4 MG PO TABS: 4.0000 mg | ORAL_TABLET | Freq: Four times a day (QID) | ORAL | Status: DC | PRN

## 2013-04-28 NOTE — H&P (Signed)
[facility] History and Physical  Margaret Sheppard ZOX:[id number] DOB: Jul 07, 1965 DOA: 04/28/2013   PCP: Health department, although she hasn't seen them in about 6 months  Specialists: None  Chief Complaint: Shortness of breath with cough for 7 days  HPI: Margaret Sheppard is a 47 y.o. female with a past medical history COPD, hypothyroidism, hypertension, who was in her usual state of health till about 7 days ago, when she started having sinus congestion, sore throat, headaches. This was followed by onset of cough with greenish expectoration. She had poor appetite. She felt weak and tired and fatigued. Denies any blood in the sputum. Had shortness of breath and wheezing. Denies any leg swelling. She had some chills but no fever. Denies any sick contacts. She tried taking DayQuil followed by Nyquil with no relief. She has had minimal amount of sharp chest pain, but only with cough, none otherwise. She has been admitted before for similar reasons back in 2013. She also mentions some pressure with urination, but denies any blood in the urine. She has received breathing treatments in the ED, and is feeling slightly better. She was noted to be hypoxic to 70s on room air  Home Medications: Prior to Admission medications   Medication Sig Start Date End Date Taking? Authorizing Provider  ARIPiprazole (ABILIFY) 5 MG tablet Take 5 mg by mouth daily.   Yes Historical Provider, MD  aspirin 325 MG tablet Take 650 mg by mouth once as needed. For fever   Yes Historical Provider, MD  Aspirin-Salicylamide-Caffeine (BC HEADACHE) 325-95-16 MG TABS Take 1 packet by mouth daily as needed (for pain/fever).    Yes Historical Provider, MD  Cyanocobalamin (VITAMIN B-12) 5000 MCG SUBL Place 1-2 tablets under the tongue daily.    Yes Historical Provider, MD  gabapentin (NEURONTIN) 300 MG capsule Take 300-900 mg by mouth daily as needed. prescribed one capsule three times daily   Yes Historical Provider, MD   hydroxypropyl methylcellulose (ISOPTO TEARS) 2.5 % ophthalmic solution Place 1 drop into both eyes 3 (three) times daily as needed. For dry eyes/contacts   Yes Historical Provider, MD  levothyroxine (SYNTHROID, LEVOTHROID) 125 MCG tablet Take 125 mcg by mouth daily before breakfast.   Yes Historical Provider, MD  albuterol (PROVENTIL HFA) 108 (90 BASE) MCG/ACT inhaler Inhale 2 puffs into the lungs every 6 (six) hours as needed. For shortness of breath    Historical Provider, MD    Allergies:  Allergies  Allergen Reactions  . Lithium Other (See Comments)    Pt states it makes her kidney's hurt  . Sulfa Antibiotics Hives  . Penicillins Hives, Nausea And Vomiting and Rash    Past Medical History: Past Medical History  Diagnosis Date  . Hypertension   . Bipolar 1 disorder   . Asthma   . COPD (chronic obstructive pulmonary disease)     Past Surgical History  Procedure Laterality Date  . Tubal ligation    . Cesarean section      Social History: Patient lives with her boyfriend. She works as a Child psychotherapist, however, has not been able to go to her work for the last week. Smokes one pack of cigarettes on a daily basis. No alcohol use. No illicit drug use. Independent with daily activities.  Family History: She mentions history of heart disease in one of her grandparents  Review of Systems - History obtained from the patient General ROS: positive for  - fatigue Psychological ROS: negative Ophthalmic ROS: negative ENT ROS: negative Allergy  and Immunology ROS: negative Hematological and Lymphatic ROS: negative Endocrine ROS: negative Respiratory ROS: as in hpi Cardiovascular ROS: as in hpi Gastrointestinal ROS: no abdominal pain, change in bowel habits, or black or bloody stools Genito-Urinary ROS: no dysuria, trouble voiding, or hematuria Musculoskeletal ROS: negative Neurological ROS: no TIA or stroke symptoms Dermatological ROS: negative  Physical Examination  Filed Vitals:    04/28/13 1915 04/28/13 1927 04/28/13 1930 04/28/13 2000  BP:    120/62  Pulse:   102 106  Temp:      TempSrc:      Resp:   19 19  Height:      Weight:      SpO2: 92% 100% 98% 95%    General appearance: alert, cooperative, appears stated age and no distress Head: Normocephalic, without obvious abnormality, atraumatic Eyes: conjunctivae/corneas clear. PERRL, EOM's intact. Throat: lips, mucosa, and tongue normal; teeth and gums normal Neck: no adenopathy, no carotid bruit, no JVD, supple, symmetrical, trachea midline and thyroid not enlarged, symmetric, no tenderness/mass/nodules Resp: She has end expiratory wheezing bilaterally. No crackles. No rhonchi. Cardio: regular rate and rhythm, S1, S2 normal, no murmur, click, rub or gallop GI: soft, non-tender; bowel sounds normal; no masses,  no organomegaly Extremities: extremities normal, atraumatic, no cyanosis or edema Pulses: 2+ and symmetric Skin: Skin color, texture, turgor normal. No rashes or lesions Lymph nodes: Cervical, supraclavicular, and axillary nodes normal. Neurologic: Alert and oriented x3. No focal neurological deficits are present  Laboratory Data: Results for orders placed during the hospital encounter of 04/28/13 (from the past 48 hour(s))  BASIC METABOLIC PANEL     Status: None   Collection Time    04/28/13  4:45 PM      Result Value Range   Sodium 142  135 - 145 mEq/L   Potassium 3.9  3.5 - 5.1 mEq/L   Chloride 99  96 - 112 mEq/L   CO2 32  19 - 32 mEq/L   Glucose, Bld 90  70 - 99 mg/dL   BUN 10  6 - 23 mg/dL   Creatinine, Ser 0.45  0.50 - 1.10 mg/dL   Calcium 9.2  8.4 - 40.9 mg/dL   GFR calc non Af Amer >90  >90 mL/min   GFR calc Af Amer >90  >90 mL/min   Comment: (NOTE)     The eGFR has been calculated using the CKD EPI equation.     This calculation has not been validated in all clinical situations.     eGFR's persistently <90 mL/min signify possible Chronic Kidney     Disease.  CBC WITH DIFFERENTIAL      Status: Abnormal   Collection Time    04/28/13  4:45 PM      Result Value Range   WBC 11.9 (*) 4.0 - 10.5 K/uL   RBC 4.42  3.87 - 5.11 MIL/uL   Hemoglobin 13.4  12.0 - 15.0 g/dL   HCT 81.1  91.4 - 78.2 %   MCV 96.6  78.0 - 100.0 fL   MCH 30.3  26.0 - 34.0 pg   MCHC 31.4  30.0 - 36.0 g/dL   RDW 95.6  21.3 - 08.6 %   Platelets 302  150 - 400 K/uL   Neutrophils Relative % 81 (*) 43 - 77 %   Neutro Abs 9.7 (*) 1.7 - 7.7 K/uL   Lymphocytes Relative 13  12 - 46 %   Lymphs Abs 1.6  0.7 - 4.0 K/uL   Monocytes Relative  5  3 - 12 %   Monocytes Absolute 0.6  0.1 - 1.0 K/uL   Eosinophils Relative 0  0 - 5 %   Eosinophils Absolute 0.0  0.0 - 0.7 K/uL   Basophils Relative 1  0 - 1 %   Basophils Absolute 0.1  0.0 - 0.1 K/uL    Radiology Reports: Dg Chest Portable 1 View  04/28/2013   CLINICAL DATA:  Shortness of breath, COPD, asthma  EXAM: PORTABLE CHEST - 1 VIEW  COMPARISON:  06/05/2012  FINDINGS: Stable hyperinflation compatible with background COPD/ emphysema. Stable diffuse punctate calcified granulomas bilaterally. Cardiomegaly without CHF or pneumonia. No collapse, consolidation, pneumonia, effusion, or pneumothorax. Trachea midline. No significant interval change.  IMPRESSION: Stable COPD/ emphysema and remote granulomatous disease.  Cardiomegaly without CHF   Electronically Signed   By: Ruel Favors M.D.   On: 04/28/2013 17:03    Problem List  Principal Problem:   Acute exacerbation of chronic obstructive pulmonary disease (COPD) Active Problems:   Tobacco abuse   Bipolar 1 disorder   COPD (chronic obstructive pulmonary disease)   Unspecified hypothyroidism   Acute respiratory failure with hypoxia   Assessment: This is a 47 year old, Caucasian female, with a past medical history of COPD, who presents with shortness of breath and wheezing for 7 days. Chest x-ray does not show any acute findings, but she does have diffuse wheezing in bilateral lung fields. She was also hypoxic.  This is most likely acute COPD exacerbation.  Plan: #1 acute COPD exacerbation with acute respiratory failure, and hypoxia: She'll be admitted to the hospital and started on nebulizer treatments, steroids, and antibiotics. She'll be given oxygen. Smoking cessation counseling was provided. Nicotine patch will be utilized. Chest pain is suggestive of pleuritic pain, secondary to acute COPD exacerbation.  #2. Tobacco abuse: As above.   #3 history of hypothyroidism: Continue with Synthroid.  #4 history of bipolar disorder: Continue with her psychotropic agents   DVT Prophylaxis: Lovenox Code Status: Full code Family Communication: Discussed with the patient  Disposition Plan: Admit to MedSurg   Further management decisions will depend on results of further testing and patient's response to treatment.  [facility]  [facility] Pager [phone]  If 7PM-7AM, please contact night-coverage [email or web address] Password [facility]  04/28/2013, 8:27 PM

## 2013-04-28 NOTE — ED Notes (Signed)
Patient placed on 3L [place] after o2 sat of 67%

## 2013-04-28 NOTE — ED Provider Notes (Signed)
CSN: [id number]     Arrival date & time 04/28/13  1616 History   First MD Initiated Contact with Patient 04/28/13 1629     Chief complaint - Short of breath   Patient is a 47 y.o. female presenting with shortness of breath. The history is provided by the patient.  Shortness of Breath Severity:  Severe Onset quality:  Gradual Duration:  1 week Timing:  Intermittent Progression:  Worsening Chronicity:  Recurrent Relieved by:  Nothing Worsened by:  Nothing tried Associated symptoms: chest pain, cough and wheezing   Associated symptoms: no fever and no vomiting   Associated symptoms comment:  Chest pain with cough  Risk factors: tobacco use   pt reports increased cough with green sputum and SOB for past week She is a current smoker She is not on home oxygen She reports CP with cough    Past Medical History  Diagnosis Date  . Hypertension   . Bipolar 1 disorder   . Asthma   . COPD (chronic obstructive pulmonary disease)    Past Surgical History  Procedure Laterality Date  . Tubal ligation    . Cesarean section     History reviewed. No pertinent family history. History  Substance Use Topics  . Smoking status: Current Every Day Smoker -- 0.50 packs/day    Types: Cigarettes  . Smokeless tobacco: Not on file  . Alcohol Use: No     Comment: occ   OB History   Grav Para Term Preterm Abortions TAB SAB Ect Mult Living                 Review of Systems  Constitutional: Negative for fever.  Respiratory: Positive for cough, shortness of breath and wheezing.   Cardiovascular: Positive for chest pain.  Gastrointestinal: Negative for vomiting.  All other systems reviewed and are negative.    Allergies  Lithium; Sulfa antibiotics; and Penicillins  Home Medications   Current Outpatient Rx  Name  Route  Sig  Dispense  Refill  . ARIPiprazole (ABILIFY) 5 MG tablet   Oral   Take 5 mg by mouth daily.         Marland Kitchen aspirin 325 MG tablet   Oral   Take 650 mg by mouth once  as needed. For fever         . Aspirin-Salicylamide-Caffeine (BC HEADACHE) 325-95-16 MG TABS   Oral   Take 1 packet by mouth daily as needed (for pain/fever).          . Cyanocobalamin (VITAMIN B-12) 5000 MCG SUBL   Sublingual   Place 1-2 tablets under the tongue daily.          Marland Kitchen gabapentin (NEURONTIN) 300 MG capsule   Oral   Take 300-900 mg by mouth daily as needed. prescribed one capsule three times daily         . hydroxypropyl methylcellulose (ISOPTO TEARS) 2.5 % ophthalmic solution   Both Eyes   Place 1 drop into both eyes 3 (three) times daily as needed. For dry eyes/contacts         . levothyroxine (SYNTHROID, LEVOTHROID) 125 MCG tablet   Oral   Take 125 mcg by mouth daily before breakfast.         . albuterol (PROVENTIL HFA) 108 (90 BASE) MCG/ACT inhaler   Inhalation   Inhale 2 puffs into the lungs every 6 (six) hours as needed. For shortness of breath          BP 144/80  Pulse 86  Temp(Src) 97.6 F (36.4 C) (Oral)  Resp 20  Ht 5' (1.524 m)  Wt 118 lb (53.524 kg)  BMI 23.05 kg/m2  SpO2 100%  LMP 07/05/2010 Physical Exam CONSTITUTIONAL: Well developed/well nourished HEAD: Normocephalic/atraumatic EYES: EOMI/PERRL ENMT: Mucous membranes moist NECK: supple no meningeal signs SPINE:entire spine nontender CV: S1/S2 noted, no murmurs/rubs/gallops noted LUNGS: coarse wheezing noted bilaterally, tachypnea noted ABDOMEN: soft, nontender, no rebound or guarding GU:no cva tenderness NEURO: Pt is awake/alert, moves all extremitiesx4 EXTREMITIES: pulses normal, full ROM SKIN: warm, color normal PSYCH: no abnormalities of mood noted  ED Course  Procedures (including critical care time)  6:21 PM Pt advised to quit smoking 7:36 PM Pt given multiple nebs and improved at rest, but had oxygen desat while walking, will need admission for further therapy of COPD exacerbation D/w dr Rito Ehrlich, will admit to med-surg  Labs Review Labs Reviewed  CBC WITH  DIFFERENTIAL - Abnormal; Notable for the following:    WBC 11.9 (*)    Neutrophils Relative % 81 (*)    Neutro Abs 9.7 (*)    All other components within normal limits  BASIC METABOLIC PANEL   Imaging Review Dg Chest Portable 1 View  04/28/2013   CLINICAL DATA:  Shortness of breath, COPD, asthma  EXAM: PORTABLE CHEST - 1 VIEW  COMPARISON:  06/05/2012  FINDINGS: Stable hyperinflation compatible with background COPD/ emphysema. Stable diffuse punctate calcified granulomas bilaterally. Cardiomegaly without CHF or pneumonia. No collapse, consolidation, pneumonia, effusion, or pneumothorax. Trachea midline. No significant interval change.  IMPRESSION: Stable COPD/ emphysema and remote granulomatous disease.  Cardiomegaly without CHF   Electronically Signed   By: Ruel Favors M.D.   On: 04/28/2013 17:03    EKG Interpretation    Date/Time:  Monday April 28 2013 17:47:41 EST Ventricular Rate:  90 PR Interval:  114 QRS Duration: 76 QT Interval:  368 QTC Calculation: 450 R Axis:   97 Text Interpretation:  Normal sinus rhythm Rightward axis Borderline ECG No previous ECGs available Confirmed by Bebe Shaggy  MD, Luisfernando Brightwell [phone]) on 04/28/2013 5:59:20 PM            MDM  No diagnosis found. Nursing notes including past medical history and social history reviewed and considered in documentation Labs/vital reviewed and considered xrays reviewed and considered     Joya Gaskins, MD 04/28/13 [phone]

## 2013-04-28 NOTE — ED Notes (Signed)
Pt.s reports generalized weakness, difficulties breathing, coughing mucous, nasal congestion. Pt states she came in because she feels short of breath.

## 2013-04-29 LAB — URINALYSIS, ROUTINE W REFLEX MICROSCOPIC
Bilirubin Urine: NEGATIVE
Hgb urine dipstick: NEGATIVE
Ketones, ur: NEGATIVE mg/dL
Nitrite: NEGATIVE
Protein, ur: 100 mg/dL — AB
Urobilinogen, UA: 0.2 mg/dL (ref 0.0–1.0)
pH: 6 (ref 5.0–8.0)

## 2013-04-29 LAB — URINE MICROSCOPIC-ADD ON

## 2013-04-29 MED ORDER — METHYLPREDNISOLONE SODIUM SUCC 125 MG IJ SOLR
60.0000 mg | Freq: Three times a day (TID) | INTRAMUSCULAR | Status: DC
  Administered 2013-04-29 – 2013-04-30 (×4): 60 mg via INTRAVENOUS
  Filled 2013-04-29 (×5): qty 2

## 2013-04-29 MED FILL — Albuterol Sulfate Soln Nebu 0.5% (5 MG/ML): RESPIRATORY_TRACT | Qty: 0.5 | Status: AC

## 2013-04-29 NOTE — Progress Notes (Signed)
Utilization Review Complete  

## 2013-04-29 NOTE — Progress Notes (Addendum)
PATIENT DETAILS Name: Margaret Sheppard Age: 47 y.o. Sex: female Date of Birth: 1965/11/19 Admit Date: 04/28/2013 Admitting Physician Maretta Bees, MD PCP:No PCP Per Patient  Brief summary 47 year old Caucasian female history of COPD, ongoing tobacco abuse, not on any inhaler regimen as outpatient, admitted to the hospital on 04/28/13 with shortness of breath felt to be secondary to a COPD exacerbation. Shortness of breath, was preceded by sore throat, sinus congestion and headaches.  Subjective: Shortness of breath better than on admission.  Assessment/Plan: Principal Problem:   Acute exacerbation of chronic obstructive pulmonary disease (COPD) - clinically improved and on admission  - Continue scheduled nebulized bronchodilators, decrease Solu-Medrol to every 8 hours, continue empiric Levaquin  - On exam, good air entry, still with coarse rhonchi all over. However looks comfortable, without using any accessory muscles and easy speaking in full sentences.  - Chest x-ray 12/8-no infiltrates. - Patient on discharge and needs to be on appropriate inhaler regimen- including rescue inhaler and possible Spiriva.  Antibiotic data Levaquin 12/8 >>   Acute hypoxic respiratory failure - Secondary to above - Wean off oxygen, may need home O2 saturations screening prior to discharge.  Tobacco abuse - On transdermal nicotine - Counseled extensively, at this time she does not appear willing to quit.   History of hypothyroidism  - Continue Synthroid   History of bipolar disorder - Stable - Continue Abilify, Neurontin  Disposition: Remain inpatient- suspect can be discharged in the next 48 hours   DVT Prophylaxis: Prophylactic Lovenox   Code Status: Full code   Family Communication  None at bedside during rounds.   Procedures:  None  CONSULTS:  None  Time spent 40 minutes-which includes 50% of the time with face-to-face with patient  counseling regarding  disease process, management and the importance of tobacco cessation.    MEDICATIONS: Scheduled Meds: . ARIPiprazole  5 mg Oral Daily  . enoxaparin (LOVENOX) injection  40 mg Subcutaneous Q24H  . gabapentin  300 mg Oral TID  . guaiFENesin  600 mg Oral BID  . influenza vac split quadrivalent PF  0.5 mL Intramuscular Tomorrow-1000  . levalbuterol  0.63 mg Nebulization Q4H  . levofloxacin (LEVAQUIN) IV  500 mg Intravenous Q24H  . levothyroxine  125 mcg Oral QAC breakfast  . methylPREDNISolone (SOLU-MEDROL) injection  60 mg Intravenous Q6H  . nicotine  14 mg Transdermal Daily  . sodium chloride  3 mL Intravenous Q12H   Continuous Infusions:  PRN Meds:.sodium chloride, acetaminophen, acetaminophen, levalbuterol, ondansetron (ZOFRAN) IV, ondansetron, sodium chloride  Antibiotics: Anti-infectives   Start     Dose/Rate Route Frequency Ordered Stop   04/28/13 2200  levofloxacin (LEVAQUIN) IVPB 500 mg     500 mg 100 mL/hr over 60 Minutes Intravenous Every 24 hours 04/28/13 2027     04/28/13 1830  azithromycin (ZITHROMAX) 500 mg in dextrose 5 % 250 mL IVPB     500 mg 250 mL/hr over 60 Minutes Intravenous  Once 04/28/13 1821 04/28/13 2011       PHYSICAL EXAM: Vital signs in last 24 hours: Filed Vitals:   04/28/13 2315 04/29/13 0307 04/29/13 0511 04/29/13 0718  BP:   105/67   Pulse:   83   Temp:   97.7 F (36.5 C)   TempSrc:   Oral   Resp:   21   Height:      Weight:      SpO2: 83% 96% 95% 90%    Weight change:  [employer]  04/28/13 1624  Weight: 53.524 kg (118 lb)   Body mass index is 23.05 kg/(m^2).   Gen Exam: Awake and alert with clear speech.   Neck: Supple, No JVD.   Chest: Good air entry bilaterally, coarse rhonchi. CVS: S1 S2 Regular, no murmurs.  Abdomen: soft, BS +, non tender, non distended.  Extremities: no edema, lower extremities warm to touch Neurologic: Non Focal.   Skin: No Rash.   Wounds: N/A.    Intake/Output from previous  day:  Intake/Output Summary (Last 24 hours) at 04/29/13 0948 Last data filed at 04/29/13 4782  Gross per 24 hour  Intake    120 ml  Output      0 ml  Net    120 ml     LAB RESULTS: CBC  Recent Labs Lab 04/28/13 1645  WBC 11.9*  HGB 13.4  HCT 42.7  PLT 302  MCV 96.6  MCH 30.3  MCHC 31.4  RDW 15.2  LYMPHSABS 1.6  MONOABS 0.6  EOSABS 0.0  BASOSABS 0.1    Chemistries   Recent Labs Lab 04/28/13 1645  NA 142  K 3.9  CL 99  CO2 32  GLUCOSE 90  BUN 10  CREATININE 0.68  CALCIUM 9.2    CBG: No results found for this basename: GLUCAP,  in the last 168 hours  GFR Estimated Creatinine Clearance: 61.8 ml/min (by C-G formula based on Cr of 0.68).  Coagulation profile No results found for this basename: INR, PROTIME,  in the last 168 hours  Cardiac Enzymes No results found for this basename: CK, CKMB, TROPONINI, MYOGLOBIN,  in the last 168 hours  No components found with this basename: POCBNP,  No results found for this basename: DDIMER,  in the last 72 hours No results found for this basename: HGBA1C,  in the last 72 hours No results found for this basename: CHOL, HDL, LDLCALC, TRIG, CHOLHDL, LDLDIRECT,  in the last 72 hours No results found for this basename: TSH, T4TOTAL, FREET3, T3FREE, THYROIDAB,  in the last 72 hours No results found for this basename: VITAMINB12, FOLATE, FERRITIN, TIBC, IRON, RETICCTPCT,  in the last 72 hours No results found for this basename: LIPASE, AMYLASE,  in the last 72 hours  Urine Studies No results found for this basename: UACOL, UAPR, USPG, UPH, UTP, UGL, UKET, UBIL, UHGB, UNIT, UROB, ULEU, UEPI, UWBC, URBC, UBAC, CAST, CRYS, UCOM, BILUA,  in the last 72 hours  MICROBIOLOGY: No results found for this or any previous visit (from the past 240 hour(s)).  RADIOLOGY STUDIES/RESULTS: Dg Chest Portable 1 View  04/28/2013   CLINICAL DATA:  Shortness of breath, COPD, asthma  EXAM: PORTABLE CHEST - 1 VIEW  COMPARISON:  06/05/2012   FINDINGS: Stable hyperinflation compatible with background COPD/ emphysema. Stable diffuse punctate calcified granulomas bilaterally. Cardiomegaly without CHF or pneumonia. No collapse, consolidation, pneumonia, effusion, or pneumothorax. Trachea midline. No significant interval change.  IMPRESSION: Stable COPD/ emphysema and remote granulomatous disease.  Cardiomegaly without CHF   Electronically Signed   By: Ruel Favors M.D.   On: 04/28/2013 17:03    Jeoffrey Massed, MD  [facility] Pager:[phone]  If 7PM-7AM, please contact night-coverage [email or web address] Password [email or web address] 04/29/2013, 9:48 AM   LOS: 1 day

## 2013-04-30 LAB — BASIC METABOLIC PANEL
Calcium: 9.1 mg/dL (ref 8.4–10.5)
Chloride: 99 mEq/L (ref 96–112)
Creatinine, Ser: 0.58 mg/dL (ref 0.50–1.10)
GFR calc Af Amer: >90 mL/min (ref >90)
GFR calc non Af Amer: >90 mL/min (ref >90)
Glucose, Bld: 166 mg/dL — ABNORMAL HIGH (ref 70–99)
Potassium: 4.5 mEq/L (ref 3.5–5.1)

## 2013-04-30 LAB — CBC
MCHC: 30.9 g/dL (ref 30.0–36.0)
Platelets: 255 10*3/uL (ref 150–400)
RDW: 15.4 % (ref 11.5–15.5)
WBC: 10.6 10*3/uL — ABNORMAL HIGH (ref 4.0–10.5)

## 2013-04-30 MED ORDER — METHYLPREDNISOLONE SODIUM SUCC 125 MG IJ SOLR
60.0000 mg | Freq: Two times a day (BID) | INTRAMUSCULAR | Status: DC
  Administered 2013-05-01 – 2013-05-02 (×4): 60 mg via INTRAVENOUS
  Filled 2013-04-30 (×4): qty 2

## 2013-04-30 MED ORDER — LEVOFLOXACIN 500 MG PO TABS
500.0000 mg | ORAL_TABLET | Freq: Every day | ORAL | Status: DC
  Administered 2013-04-30 – 2013-05-01 (×2): 500 mg via ORAL
  Filled 2013-04-30 (×2): qty 1

## 2013-04-30 NOTE — Progress Notes (Signed)
PHARMACIST - PHYSICIAN COMMUNICATION DR:   Jerral Ralph CONCERNING: Antibiotic IV to Oral Route Change Policy  RECOMMENDATION: This patient is receiving Levaquin by the intravenous route.  Based on criteria approved by the Pharmacy and Therapeutics Committee, the antibiotic(s) is/are being converted to the equivalent oral dose form(s).   DESCRIPTION: These criteria include:  Patient being treated for a respiratory tract infection, urinary tract infection, cellulitis or clostridium difficile associated diarrhea if on metronidazole  The patient is not neutropenic and does not exhibit a GI malabsorption state  The patient is eating (either orally or via tube) and/or has been taking other orally administered medications for a least 24 hours  The patient is improving clinically and has a Tmax < 100.5  If you have questions about this conversion, please contact the Pharmacy Department  [x]   [phone] )  [facility] []   [phone] )  [facility]  []   [phone] )  [facility] []   [phone] )  [facility]   S. Margo Aye, PharmD

## 2013-04-30 NOTE — Care Management Note (Addendum)
    Page 1 of 1   05/02/2013     3:03:05 PM   CARE MANAGEMENT NOTE 05/02/2013  Patient:  Margaret Sheppard, Margaret Sheppard   Account Number:  [id number]  Date Initiated:  04/30/2013  Documentation initiated by:  Rosemary Holms  Subjective/Objective Assessment:   Pt admitted from home where she lives with her Significant other. Per Pt, she does not have a PCP.     Action/Plan:   Appt set up next Monday at the [facility] clinic for financial and medical appts. list of needed financial documents given to pt   Anticipated DC Date:  04/30/2013   Anticipated DC Plan:  HOME/SELF CARE      DC Planning Services  CM consult      Choice offered to / List presented to:             Status of service:  Completed, signed off Medicare Important Message given?   (If response is "NO", the following Medicare IM given date fields will be blank) Date Medicare IM given:   Date Additional Medicare IM given:    Discharge Disposition:  HOME/SELF CARE  Per UR Regulation:    If discussed at Long Length of Stay Meetings, dates discussed:    Comments:  05/02/13 Rosemary Holms RN BSN CM Pt DCing. No O2 needed at DC. 1500 Pt found to have O2 in 70's. [facility] alerted to DC and referral for home O2.  04/30/13 Dolorez Jeffrey Leanord Hawking RN BSN CM

## 2013-04-30 NOTE — Progress Notes (Signed)
[facility] PROGRESS NOTE  Margaret Sheppard:[id number] DOB: 1966-03-27 DOA: 04/28/2013 PCP: No PCP Per Patient  Assessment/Plan: 1. Acute COPD exacerbation: Slowly improving. Continue steroids, nebulizers, oxygen. 2. Acute hypoxic respiratory failure secondary to COPD exacerbation: Continue oxygen therapy, treatment as above. 3. Cigarette smoker: Recommend cessation. Continue transdermal nicotine. I recommend smoking cessation. 4. Hypothyroidism 5. Bipolar disorder: Stable. Continue Abilify, Neurontin   Continue current therapy, steroids, bronchodilators, antibiotics. Hopefully home in the next 48 hours.  Pending studies:   none  Code Status: full code DVT prophylaxis: Lovenox Family Communication:  Disposition Plan: as above  Brendia Sacks, MD  [facility]  Pager [phone] If 7PM-7AM, please contact night-coverage at [email or web address], password [facility] 04/30/2013, 1:56 PM  LOS: 2 days   Summary: 47 year old Caucasian female history of COPD, ongoing tobacco abuse, not on any inhaler regimen as outpatient, admitted to the hospital on 04/28/13 with shortness of breath felt to be secondary to a COPD exacerbation. Shortness of breath, was preceded by sore throat, sinus congestion and headaches.  Consultants:  None   Procedures:  None   Antibiotics:  Levaquin 12/8 >>   HPI/Subjective: Feels better but still short of breath. Hypoxia: Room air. Still coughing. No new issues.  Objective: Filed Vitals:   04/30/13 0715 04/30/13 1131 04/30/13 1347 04/30/13 1349  BP:      Pulse:      Temp:      TempSrc:      Resp:      Height:      Weight:      SpO2: 93% 95% 84% 92%    Intake/Output Summary (Last 24 hours) at 04/30/13 1356 Last data filed at 04/30/13 1239  Gross per 24 hour  Intake    720 ml  Output      0 ml  Net    720 ml     Filed Weights   04/28/13 1624  Weight: 53.524 kg (118 lb)    Exam:   Afebrile, vital signs stable. Hypoxic on room  air.  General: Appears calm and comfortable.  Cardiovascular: Regular rate and rhythm. No murmur, rub or gallop. No lower extremity edema.  Respiratory: Bilateral wheezes, fair air movement. No rhonchi or rales. Normal respiratory effort. Able to speak in full sentences.  Data Reviewed:  Basic metabolic panel unremarkable  WBC improved, 2.6, hemoglobin 11.8.  Scheduled Meds: . ARIPiprazole  5 mg Oral Daily  . enoxaparin (LOVENOX) injection  40 mg Subcutaneous Q24H  . gabapentin  300 mg Oral TID  . guaiFENesin  600 mg Oral BID  . levalbuterol  0.63 mg Nebulization Q4H  . levofloxacin  500 mg Oral q1800  . levothyroxine  125 mcg Oral QAC breakfast  . methylPREDNISolone (SOLU-MEDROL) injection  60 mg Intravenous Q8H  . nicotine  14 mg Transdermal Daily  . sodium chloride  3 mL Intravenous Q12H   Continuous Infusions:   Principal Problem:   Acute exacerbation of chronic obstructive pulmonary disease (COPD) Active Problems:   Tobacco abuse   Bipolar 1 disorder   COPD (chronic obstructive pulmonary disease)   Unspecified hypothyroidism   Acute respiratory failure with hypoxia   Time spent 20 minutes

## 2013-04-30 NOTE — Progress Notes (Signed)
O2 sat on room air at rest, drops to 84%.  O2 sat rebounds to 92% shortly after replacing 2 lpm via [place].

## 2013-05-01 MED ORDER — DIPHENHYDRAMINE HCL 25 MG PO CAPS
25.0000 mg | ORAL_CAPSULE | Freq: Once | ORAL | Status: AC
  Administered 2013-05-01: 25 mg via ORAL
  Filled 2013-05-01: qty 1

## 2013-05-01 MED ORDER — IPRATROPIUM BROMIDE 0.02 % IN SOLN
0.5000 mg | Freq: Four times a day (QID) | RESPIRATORY_TRACT | Status: DC
  Administered 2013-05-01 – 2013-05-02 (×3): 0.5 mg via RESPIRATORY_TRACT
  Filled 2013-05-01 (×3): qty 2.5

## 2013-05-01 NOTE — Progress Notes (Signed)
[facility] PROGRESS NOTE  Margaret Sheppard ZOX:[id number] DOB: November 13, 1965 DOA: 04/28/2013 PCP: No PCP Per Patient  Assessment/Plan: 1. Acute COPD exacerbation: Slowly improving. Continue steroids, nebulizers, oxygen. 2. Acute hypoxic respiratory failure secondary to COPD exacerbation: Slowly improving. Wean oxygen as tolerated. 3. Cigarette smoker: Recommend cessation. Continue transdermal nicotine.  4. Hypothyroidism 5. Bipolar disorder: Stable. Continue Abilify, Neurontin   Continue current therapy, steroids without change, bronchodilators, antibiotics. Hopefully home in the next 24 hours.  Pending studies:   none  Code Status: full code DVT prophylaxis: Lovenox Family Communication:  Disposition Plan: as above  Brendia Sacks, MD  [facility]  Pager [phone] If 7PM-7AM, please contact night-coverage at [email or web address], password [facility] 05/01/2013, 12:29 PM  LOS: 3 days   Summary: 47 year old Caucasian female history of COPD, ongoing tobacco abuse, not on any inhaler regimen as outpatient, admitted to the hospital on 04/28/13 with shortness of breath felt to be secondary to a COPD exacerbation. Shortness of breath, was preceded by sore throat, sinus congestion and headaches.  Consultants:  None   Procedures:  None   Antibiotics:  Levaquin 12/8 >>   HPI/Subjective: Slowly improving. Ambulating to the bathroom without difficulty. Breathing is somewhat better.  Objective: Filed Vitals:   05/01/13 0425 05/01/13 0556 05/01/13 0711 05/01/13 1046  BP:  112/65    Pulse:  76    Temp:  97.8 F (36.6 C)    TempSrc:  Oral    Resp:  20    Height:      Weight:      SpO2: 93% 96% 100% 92%    Intake/Output Summary (Last 24 hours) at 05/01/13 1229 Last data filed at 05/01/13 0853  Gross per 24 hour  Intake    720 ml  Output      0 ml  Net    720 ml     Filed Weights   04/28/13 1624  Weight: 53.524 kg (118 lb)    Exam:   Afebrile, vital signs  stable. Hypoxia somewhat improved.  General: Appears calm and comfortable. Nontoxic.  Cardiovascular: Regular rate and rhythm. No murmur, rub or gallop.  Respiratory: Diffuse bilateral wheezes, prolonged expiration, poor air movement. Mild increased respiratory effort.  Data Reviewed:  Basic metabolic panel unremarkable  White blood cell count 10.6, trending downwards  Hemoglobin likely at baseline, a lovely  Scheduled Meds: . ARIPiprazole  5 mg Oral Daily  . enoxaparin (LOVENOX) injection  40 mg Subcutaneous Q24H  . gabapentin  300 mg Oral TID  . guaiFENesin  600 mg Oral BID  . levalbuterol  0.63 mg Nebulization Q4H  . levofloxacin  500 mg Oral q1800  . levothyroxine  125 mcg Oral QAC breakfast  . methylPREDNISolone (SOLU-MEDROL) injection  60 mg Intravenous Q12H  . nicotine  14 mg Transdermal Daily  . sodium chloride  3 mL Intravenous Q12H   Continuous Infusions:   Principal Problem:   Acute exacerbation of chronic obstructive pulmonary disease (COPD) Active Problems:   Tobacco abuse   Bipolar 1 disorder   COPD (chronic obstructive pulmonary disease)   Unspecified hypothyroidism   Acute respiratory failure with hypoxia   Time spent 15 minutes

## 2013-05-02 MED ORDER — PREDNISONE 20 MG PO TABS: 40.0000 mg | ORAL_TABLET | Freq: Every day | ORAL | Status: DC

## 2013-05-02 MED ORDER — NICOTINE 14 MG/24HR TD PT24: 14.0000 mg | MEDICATED_PATCH | Freq: Every day | TRANSDERMAL | Status: DC

## 2013-05-02 MED ORDER — PREDNISONE 10 MG PO TABS: ORAL_TABLET | ORAL | Status: DC

## 2013-05-02 MED ORDER — IPRATROPIUM-ALBUTEROL 20-100 MCG/ACT IN AERS
1.0000 | INHALATION_SPRAY | Freq: Four times a day (QID) | RESPIRATORY_TRACT | Status: DC
  Administered 2013-05-02: 1 via RESPIRATORY_TRACT
  Filled 2013-05-02: qty 4

## 2013-05-02 MED ORDER — IPRATROPIUM-ALBUTEROL 20-100 MCG/ACT IN AERS: 1.0000 | INHALATION_SPRAY | Freq: Four times a day (QID) | RESPIRATORY_TRACT | Status: DC

## 2013-05-02 MED ORDER — ALBUTEROL SULFATE HFA 108 (90 BASE) MCG/ACT IN AERS: 2.0000 | INHALATION_SPRAY | RESPIRATORY_TRACT | Status: DC | PRN

## 2013-05-02 MED ORDER — ALBUTEROL SULFATE HFA 108 (90 BASE) MCG/ACT IN AERS: 2.0000 | INHALATION_SPRAY | Freq: Four times a day (QID) | RESPIRATORY_TRACT | Status: DC | PRN

## 2013-05-02 NOTE — Discharge Planning (Signed)
Pt stated she was ready to go home and pain was under control.  Pt was given DC paper and educated about s/sx that would warrant calling a doctor or returning to the hospital.  Pt's IV was removed and O2 was delivered to the hospital for pt to go home with.  Pt's family was in room at time of DC and education.  Pt will be wheeled to car when ready by PCT and family.

## 2013-05-02 NOTE — Progress Notes (Signed)
[facility] PROGRESS NOTE  Margaret Sheppard:[id number] DOB: [birth year] DOA: 04/28/2013 PCP: No PCP Per Patient  Assessment/Plan: 1. Acute COPD exacerbation: Appears resolved. Steroid taper, bronchodilators. Hypoxia has resolved. Complete antibiotics. 2. Acute hypoxic respiratory failure secondary to COPD exacerbation: Resolved. 3. Cigarette smoker: I have again recommended cessation. 4. Hypothyroidism 5. Bipolar disorder: stable. Continue Abilify, Neurontin.   Change to oral steroids  Discharge on bronchodilators  Home today  Pending studies:   none  Code Status: full code DVT prophylaxis: Lovenox Family Communication:  Disposition Plan: as above  Brendia Sacks, MD  [facility]  Pager [phone] If 7PM-7AM, please contact night-coverage at [email or web address], password [facility] 05/02/2013, 1:22 PM  LOS: 4 days   Summary: 47 year old Caucasian female history of COPD, ongoing tobacco abuse, not on any inhaler regimen as outpatient, admitted to the hospital on 04/28/13 with shortness of breath felt to be secondary to a COPD exacerbation. Shortness of breath, was preceded by sore throat, sinus congestion and headaches.  Consultants:  None   Procedures:  None   Antibiotics:  Levaquin 12/8 >>   HPI/Subjective: Continues to feel better. Breathing better. Nonproductive cough.  Objective: Filed Vitals:   05/02/13 0715 05/02/13 1239 05/02/13 1245 05/02/13 1319  BP: 131/71     Pulse: 77     Temp: 98.6 F (37 C)     TempSrc: Oral     Resp: 20     Height:      Weight:      SpO2: 97% 98% 95% 92%    Intake/Output Summary (Last 24 hours) at 05/02/13 1322 Last data filed at 05/02/13 0902  Gross per 24 hour  Intake    240 ml  Output      0 ml  Net    240 ml     Filed Weights   04/28/13 1624  Weight: 53.524 kg (118 lb)    Exam:   Afebrile, vital signs stable. Hypoxia has resolved.  Cardiovascular: Regular rate and rhythm. No murmur, rub or  gallop.  Respiratory: Much improved air movement. Few wheezes. No rhonchi or rales. Normal respiratory effort. Appears calm and comfortable on room air.  General: Appears calm and comfortable.  Data Reviewed:  No new labs  Scheduled Meds: . ARIPiprazole  5 mg Oral Daily  . enoxaparin (LOVENOX) injection  40 mg Subcutaneous Q24H  . gabapentin  300 mg Oral TID  . guaiFENesin  600 mg Oral BID  . ipratropium  0.5 mg Nebulization Q6H  . levalbuterol  0.63 mg Nebulization Q4H  . levofloxacin  500 mg Oral q1800  . levothyroxine  125 mcg Oral QAC breakfast  . methylPREDNISolone (SOLU-MEDROL) injection  60 mg Intravenous Q12H  . nicotine  14 mg Transdermal Daily  . sodium chloride  3 mL Intravenous Q12H   Continuous Infusions:   Principal Problem:   Acute exacerbation of chronic obstructive pulmonary disease (COPD) Active Problems:   Tobacco abuse   Bipolar 1 disorder   COPD (chronic obstructive pulmonary disease)   Unspecified hypothyroidism   Acute respiratory failure with hypoxia

## 2013-05-02 NOTE — Progress Notes (Signed)
At 1438 Room air oxygen saturation with patient at rest was 73%. Patient's 1.5  liters of oxygen via nasal cannula was reapplied and saturation increased to 95%.

## 2013-05-02 NOTE — Progress Notes (Signed)
Room air saturation 92%, no c/o pain or shortness of breath noted.Patient out of bed to chair.

## 2013-05-02 NOTE — Discharge Summary (Addendum)
Physician Discharge Summary  Margaret Sheppard ZOX:[id number] DOB: [birth year] DOA: 04/28/2013  PCP: No PCP Per Patient  Admit date: 04/28/2013 Discharge date: 05/02/2013  Recommendations for Outpatient Follow-up:  1. Followup resolution of COPD exacerbation and acute hypoxic respiratory failure 2. Continue to encourage abstinence from smoking  3. New apartment for home oxygen 1.5 L per minute nasal cannula for acute hypoxic respiratory failure from COPD exacerbation  Follow-up Information   Follow up with Margaret Sheppard On 05/05/2013. (Appt at 1:45, bring financial eligibility requirements.)    Contact information:   [address] [place]   [phone]     Discharge Diagnoses:  1. Acute COPD exacerbation 2. Acute hypoxic respiratory failure 3. Cigarette smoker  Discharge Condition: Improved Disposition: Improved  Diet recommendation: Regular  Filed Weights   04/28/13 1624  Weight: 53.524 kg (118 lb)    History of present illness:  47 year old Caucasian female history of COPD, ongoing tobacco abuse, not on any inhaler regimen as outpatient, admitted to the hospital on 04/28/13 with shortness of breath felt to be secondary to a COPD exacerbation. Shortness of breath, was preceded by sore throat, sinus congestion and headaches.  Hospital Course:  [name] was admitted for COPD exacerbation and treated with standard care. She gradually improved, hypoxia improved but has not resolved and she is now stable for discharge. Her hospitalization was uncomplicated. See individual issues below.  1. Acute COPD exacerbation: Appears resolved. Steroid taper, bronchodilators. Hypoxic on room air down to 76% per respiratory. Plan home oxygen. Completed antibiotics. 2. Acute hypoxic respiratory failure secondary to COPD exacerbation: Resolved. 3. Cigarette smoker: I have again recommended cessation. 4. Hypothyroidism 5. Bipolar disorder: stable. Continue Abilify,  Neurontin.  Consultants:  None  Procedures:  None  Antibiotics:  Levaquin 12/8 >> 12/12  Discharge Instructions  Discharge Orders   Future Orders Complete By Expires   Diet Carb Modified  As directed    Discharge instructions  As directed    Comments:     Call your physician or seek immediate medical assistance for shortness of breath or worsening of condition.   Increase activity slowly  As directed        Medication List    STOP taking these medications       aspirin 325 MG tablet     BC HEADACHE 325-95-16 MG Tabs  Generic drug:  Aspirin-Salicylamide-Caffeine      TAKE these medications       albuterol 108 (90 BASE) MCG/ACT inhaler  Commonly known as:  PROVENTIL HFA  Inhale 2 puffs into the lungs every 6 (six) hours as needed for wheezing or shortness of breath. For shortness of breath     ARIPiprazole 5 MG tablet  Commonly known as:  ABILIFY  Take 5 mg by mouth daily.     gabapentin 300 MG capsule  Commonly known as:  NEURONTIN  Take 300-900 mg by mouth daily as needed. prescribed one capsule three times daily     hydroxypropyl methylcellulose 2.5 % ophthalmic solution  Commonly known as:  ISOPTO TEARS  Place 1 drop into both eyes 3 (three) times daily as needed. For dry eyes/contacts     Ipratropium-Albuterol 20-100 MCG/ACT Aers respimat  Commonly known as:  COMBIVENT  Inhale 1 puff into the lungs 4 (four) times daily.     levothyroxine 125 MCG tablet  Commonly known as:  SYNTHROID, LEVOTHROID  Take 125 mcg by mouth daily before breakfast.     nicotine 14 mg/24hr patch  Commonly known as:  NICODERM CQ - dosed in mg/24 hours  Place 1 patch (14 mg total) onto the skin daily.     predniSONE 10 MG tablet  Commonly known as:  DELTASONE  12/13-12/15: Take 40 mg by mouth daily. 12/16-12/18: Take 20 mg by mouth daily. 12/19-12/21: Take 10 mg by mouth daily then stop.     Vitamin B-12 5000 MCG Subl  Place 1-2 tablets under the tongue daily.        Allergies  Allergen Reactions  . Lithium Other (See Comments)    Pt states it makes her kidney's hurt  . Sulfa Antibiotics Hives  . Penicillins Hives, Nausea And Vomiting and Rash    The results of significant diagnostics from this hospitalization (including imaging, microbiology, ancillary and laboratory) are listed below for reference.    Significant Diagnostic Studies: Dg Chest Portable 1 View  04/28/2013   CLINICAL DATA:  Shortness of breath, COPD, asthma  EXAM: PORTABLE CHEST - 1 VIEW  COMPARISON:  06/05/2012  FINDINGS: Stable hyperinflation compatible with background COPD/ emphysema. Stable diffuse punctate calcified granulomas bilaterally. Cardiomegaly without CHF or pneumonia. No collapse, consolidation, pneumonia, effusion, or pneumothorax. Trachea midline. No significant interval change.  IMPRESSION: Stable COPD/ emphysema and remote granulomatous disease.  Cardiomegaly without CHF   Electronically Signed   By: Ruel Favors M.D.   On: 04/28/2013 17:03    Labs: Basic Metabolic Panel:  Recent Labs Lab 04/28/13 1645 04/30/13 0636  NA 142 139  K 3.9 4.5  CL 99 99  CO2 32 35*  GLUCOSE 90 166*  BUN 10 11  CREATININE 0.68 0.58  CALCIUM 9.2 9.1   CBC:  Recent Labs Lab 04/28/13 1645 04/30/13 0636  WBC 11.9* 10.6*  NEUTROABS 9.7*  --   HGB 13.4 11.8*  HCT 42.7 38.2  MCV 96.6 99.0  PLT 302 255    Principal Problem:   Acute exacerbation of chronic obstructive pulmonary disease (COPD) Active Problems:   Tobacco abuse   Bipolar 1 disorder   COPD (chronic obstructive pulmonary disease)   Unspecified hypothyroidism   Acute respiratory failure with hypoxia   Time coordinating discharge: 25 minutes  Signed:  Brendia Sacks, MD [facility] 05/02/2013, 1:44 PM

## 2014-06-01 ENCOUNTER — Emergency Department (HOSPITAL_COMMUNITY): Payer: Medicare Other

## 2014-06-01 ENCOUNTER — Encounter (HOSPITAL_COMMUNITY): Payer: Self-pay | Admitting: *Deleted

## 2014-06-01 ENCOUNTER — Inpatient Hospital Stay (HOSPITAL_COMMUNITY)
Admission: EM | Admit: 2014-06-01 | Discharge: 2014-06-03 | DRG: 190 | Disposition: A | Discharge status: Home / Self Care | Payer: Medicare Other | Attending: Internal Medicine | Admitting: Internal Medicine

## 2014-06-01 DIAGNOSIS — J9601 Acute respiratory failure with hypoxia: Secondary | ICD-10-CM | POA: Diagnosis not present

## 2014-06-01 DIAGNOSIS — Z882 Allergy status to sulfonamides status: Secondary | ICD-10-CM

## 2014-06-01 DIAGNOSIS — J441 Chronic obstructive pulmonary disease with (acute) exacerbation: Secondary | ICD-10-CM | POA: Diagnosis not present

## 2014-06-01 DIAGNOSIS — I10 Essential (primary) hypertension: Secondary | ICD-10-CM | POA: Diagnosis not present

## 2014-06-01 DIAGNOSIS — E039 Hypothyroidism, unspecified: Secondary | ICD-10-CM | POA: Diagnosis present

## 2014-06-01 DIAGNOSIS — Z720 Tobacco use: Secondary | ICD-10-CM | POA: Diagnosis present

## 2014-06-01 DIAGNOSIS — J45909 Unspecified asthma, uncomplicated: Secondary | ICD-10-CM | POA: Diagnosis present

## 2014-06-01 DIAGNOSIS — Z9119 Patient's noncompliance with other medical treatment and regimen: Secondary | ICD-10-CM | POA: Diagnosis present

## 2014-06-01 DIAGNOSIS — F17210 Nicotine dependence, cigarettes, uncomplicated: Secondary | ICD-10-CM | POA: Diagnosis present

## 2014-06-01 DIAGNOSIS — T380X5A Adverse effect of glucocorticoids and synthetic analogues, initial encounter: Secondary | ICD-10-CM | POA: Diagnosis not present

## 2014-06-01 DIAGNOSIS — J9621 Acute and chronic respiratory failure with hypoxia: Secondary | ICD-10-CM | POA: Diagnosis present

## 2014-06-01 DIAGNOSIS — F319 Bipolar disorder, unspecified: Secondary | ICD-10-CM | POA: Diagnosis not present

## 2014-06-01 DIAGNOSIS — Z9851 Tubal ligation status: Secondary | ICD-10-CM

## 2014-06-01 DIAGNOSIS — Z880 Allergy status to penicillin: Secondary | ICD-10-CM

## 2014-06-01 DIAGNOSIS — R739 Hyperglycemia, unspecified: Secondary | ICD-10-CM | POA: Diagnosis present

## 2014-06-01 DIAGNOSIS — Z888 Allergy status to other drugs, medicaments and biological substances status: Secondary | ICD-10-CM

## 2014-06-01 DIAGNOSIS — Z9981 Dependence on supplemental oxygen: Secondary | ICD-10-CM

## 2014-06-01 LAB — BASIC METABOLIC PANEL
Anion gap: 5 (ref 5–15)
BUN: 7 mg/dL (ref 6–23)
CO2: 34 mmol/L — ABNORMAL HIGH (ref 19–32)
CREATININE: 0.61 mg/dL (ref 0.50–1.10)
Calcium: 8.7 mg/dL (ref 8.4–10.5)
Chloride: 104 mEq/L (ref 96–112)
GFR calc non Af Amer: >90 mL/min (ref >90)
Glucose, Bld: 91 mg/dL (ref 70–99)
POTASSIUM: 4.2 mmol/L (ref 3.5–5.1)
Sodium: 143 mmol/L (ref 135–145)

## 2014-06-01 LAB — CBC
HCT: 37.6 % (ref 36.0–46.0)
Hemoglobin: 11.8 g/dL — ABNORMAL LOW (ref 12.0–15.0)
MCH: 30.6 pg (ref 26.0–34.0)
MCHC: 31.4 g/dL (ref 30.0–36.0)
MCV: 97.7 fL (ref 78.0–100.0)
Platelets: 262 10*3/uL (ref 150–400)
RBC: 3.85 MIL/uL — ABNORMAL LOW (ref 3.87–5.11)
RDW: 16.6 % — AB (ref 11.5–15.5)
WBC: 9.1 10*3/uL (ref 4.0–10.5)

## 2014-06-01 LAB — MRSA PCR SCREENING: MRSA by PCR: NEGATIVE

## 2014-06-01 MED ORDER — LEVOFLOXACIN IN D5W 750 MG/150ML IV SOLN
750.0000 mg | INTRAVENOUS | Status: DC
  Administered 2014-06-01 – 2014-06-02 (×2): 750 mg via INTRAVENOUS
  Filled 2014-06-01 (×2): qty 150

## 2014-06-01 MED ORDER — IPRATROPIUM-ALBUTEROL 0.5-2.5 (3) MG/3ML IN SOLN
3.0000 mL | Freq: Once | RESPIRATORY_TRACT | Status: AC
  Administered 2014-06-01: 3 mL via RESPIRATORY_TRACT
  Filled 2014-06-01: qty 3

## 2014-06-01 MED ORDER — IPRATROPIUM-ALBUTEROL 0.5-2.5 (3) MG/3ML IN SOLN: 3.0000 mL | RESPIRATORY_TRACT | Status: DC

## 2014-06-01 MED ORDER — NICOTINE 21 MG/24HR TD PT24
21.0000 mg | MEDICATED_PATCH | Freq: Every day | TRANSDERMAL | Status: DC
  Administered 2014-06-01 – 2014-06-03 (×3): 21 mg via TRANSDERMAL
  Filled 2014-06-01 (×3): qty 1

## 2014-06-01 MED ORDER — LEVOTHYROXINE SODIUM 112 MCG PO TABS
112.0000 ug | ORAL_TABLET | Freq: Every day | ORAL | Status: DC
  Administered 2014-06-02 – 2014-06-03 (×2): 112 ug via ORAL
  Filled 2014-06-01 (×4): qty 1

## 2014-06-01 MED ORDER — METHYLPREDNISOLONE SODIUM SUCC 125 MG IJ SOLR
125.0000 mg | Freq: Three times a day (TID) | INTRAMUSCULAR | Status: DC
  Administered 2014-06-01 – 2014-06-02 (×2): 125 mg via INTRAVENOUS
  Filled 2014-06-01 (×2): qty 2

## 2014-06-01 MED ORDER — GABAPENTIN 400 MG PO CAPS: 400.0000 mg | ORAL_CAPSULE | Freq: Two times a day (BID) | ORAL | Status: DC

## 2014-06-01 MED ORDER — IPRATROPIUM-ALBUTEROL 0.5-2.5 (3) MG/3ML IN SOLN: 3.0000 mL | RESPIRATORY_TRACT | Status: DC | PRN

## 2014-06-01 MED ORDER — ALBUTEROL (5 MG/ML) CONTINUOUS INHALATION SOLN
10.0000 mg/h | INHALATION_SOLUTION | Freq: Once | RESPIRATORY_TRACT | Status: AC
  Administered 2014-06-01: 10 mg/h via RESPIRATORY_TRACT
  Filled 2014-06-01: qty 20

## 2014-06-01 MED ORDER — ALBUTEROL SULFATE (2.5 MG/3ML) 0.083% IN NEBU
5.0000 mg | INHALATION_SOLUTION | Freq: Once | RESPIRATORY_TRACT | Status: AC
  Administered 2014-06-01: 5 mg via RESPIRATORY_TRACT
  Filled 2014-06-01: qty 6

## 2014-06-01 MED ORDER — TRAZODONE HCL 50 MG PO TABS
50.0000 mg | ORAL_TABLET | Freq: Every day | ORAL | Status: DC
  Administered 2014-06-01 – 2014-06-02 (×2): 150 mg via ORAL
  Filled 2014-06-01 (×2): qty 3

## 2014-06-01 MED ORDER — INFLUENZA VAC SPLIT QUAD 0.5 ML IM SUSY
0.5000 mL | PREFILLED_SYRINGE | INTRAMUSCULAR | Status: AC
  Administered 2014-06-02: 0.5 mL via INTRAMUSCULAR
  Filled 2014-06-01: qty 0.5

## 2014-06-01 MED ORDER — METHYLPREDNISOLONE SODIUM SUCC 125 MG IJ SOLR
125.0000 mg | Freq: Once | INTRAMUSCULAR | Status: AC
  Administered 2014-06-01: 125 mg via INTRAVENOUS
  Filled 2014-06-01: qty 2

## 2014-06-01 MED ORDER — IPRATROPIUM-ALBUTEROL 0.5-2.5 (3) MG/3ML IN SOLN
3.0000 mL | RESPIRATORY_TRACT | Status: DC
  Administered 2014-06-01 – 2014-06-03 (×9): 3 mL via RESPIRATORY_TRACT
  Filled 2014-06-01 (×9): qty 3

## 2014-06-01 MED ORDER — HEPARIN SODIUM (PORCINE) 5000 UNIT/ML IJ SOLN
5000.0000 [IU] | Freq: Three times a day (TID) | INTRAMUSCULAR | Status: DC
  Administered 2014-06-01 – 2014-06-03 (×4): 5000 [IU] via SUBCUTANEOUS
  Filled 2014-06-01 (×4): qty 1

## 2014-06-01 MED ORDER — GABAPENTIN 400 MG PO CAPS
800.0000 mg | ORAL_CAPSULE | Freq: Every day | ORAL | Status: DC
  Administered 2014-06-01 – 2014-06-02 (×2): 800 mg via ORAL
  Filled 2014-06-01 (×2): qty 2

## 2014-06-01 MED ORDER — SODIUM CHLORIDE 0.9 % IV SOLN
INTRAVENOUS | Status: DC
  Administered 2014-06-01 – 2014-06-02 (×2): via INTRAVENOUS

## 2014-06-01 MED ORDER — LEVOFLOXACIN IN D5W 750 MG/150ML IV SOLN: 750.0000 mg | INTRAVENOUS | Status: DC

## 2014-06-01 MED ORDER — GABAPENTIN 400 MG PO CAPS
400.0000 mg | ORAL_CAPSULE | Freq: Every day | ORAL | Status: DC
  Administered 2014-06-02 – 2014-06-03 (×2): 400 mg via ORAL
  Filled 2014-06-01 (×2): qty 1

## 2014-06-01 NOTE — ED Provider Notes (Signed)
CSN: [id number]     Arrival date & time 06/01/14  1540 History  This chart was scribed for Linwood Dibbles, MD by Littie Deeds, ED Scribe. This patient was seen in room APA14/APA14 and the patient's care was started at 3:59 PM.     Chief Complaint  Patient presents with  . Shortness of Breath   The history is provided by the patient. No language interpreter was used.   HPI Comments: Margaret Sheppard is a 49 y.o. female with a hx of COPD, emphysema, asthma and bronchitis who presents to the Emergency Department complaining of SOB for the past 2 weeks. Patient states she has been ill for the past 2 weeks and has also been complaining of cough, congestion, generalized weakness, and sore throat. She states that she initially started with a sore throat. Patient denies swelling, pain, diarrhea, and abdominal pain. She is normally on albuterol and is on 2L of O2 at home, although she has not been using it. She denies being on steroids.   No PCP per patient; cannot get into the [facility], she is on disabilities  Past Medical History  Diagnosis Date  . Hypertension   . Bipolar 1 disorder   . Asthma   . COPD (chronic obstructive pulmonary disease)    Past Surgical History  Procedure Laterality Date  . Tubal ligation    . Cesarean section     History reviewed. No pertinent family history. History  Substance Use Topics  . Smoking status: Current Every Day Smoker -- 0.50 packs/day    Types: Cigarettes  . Smokeless tobacco: Not on file  . Alcohol Use: No     Comment: occ   OB History    No data available     Review of Systems  HENT: Positive for congestion and sore throat.   Respiratory: Positive for cough and shortness of breath.   Cardiovascular: Negative for leg swelling.  Gastrointestinal: Negative for abdominal pain and diarrhea.  Musculoskeletal: Negative for back pain.  Neurological: Positive for weakness.  All other systems reviewed and are negative.     Allergies   Lithium; Sulfa antibiotics; and Penicillins  Home Medications   Prior to Admission medications   Medication Sig Start Date End Date Taking? Authorizing Provider  albuterol (PROVENTIL HFA) 108 (90 BASE) MCG/ACT inhaler Inhale 2 puffs into the lungs every 6 (six) hours as needed for wheezing or shortness of breath. For shortness of breath 05/02/13   Standley Brooking, MD  ARIPiprazole (ABILIFY) 5 MG tablet Take 5 mg by mouth daily.    Historical Provider, MD  Cyanocobalamin (VITAMIN B-12) 5000 MCG SUBL Place 1-2 tablets under the tongue daily.     Historical Provider, MD  gabapentin (NEURONTIN) 300 MG capsule Take 300-900 mg by mouth daily as needed. prescribed one capsule three times daily    Historical Provider, MD  hydroxypropyl methylcellulose (ISOPTO TEARS) 2.5 % ophthalmic solution Place 1 drop into both eyes 3 (three) times daily as needed. For dry eyes/contacts    Historical Provider, MD  Ipratropium-Albuterol (COMBIVENT) 20-100 MCG/ACT AERS respimat Inhale 1 puff into the lungs 4 (four) times daily. 05/02/13   Standley Brooking, MD  levothyroxine (SYNTHROID, LEVOTHROID) 125 MCG tablet Take 125 mcg by mouth daily before breakfast.    Historical Provider, MD  nicotine (NICODERM CQ - DOSED IN MG/24 HOURS) 14 mg/24hr patch Place 1 patch (14 mg total) onto the skin daily. 05/02/13   Standley Brooking, MD  predniSONE (DELTASONE) 10  MG tablet 12/13-12/15: Take 40 mg by mouth daily. 12/16-12/18: Take 20 mg by mouth daily. 12/19-12/21: Take 10 mg by mouth daily then stop. 05/02/13   Standley Brooking, MD   BP 133/79 mmHg  Pulse 110  Temp(Src) 98.8 F (37.1 C) (Oral)  Resp 24  Ht 5' (1.524 m)  Wt 124 lb (56.246 kg)  BMI 24.22 kg/m2  SpO2 88%  LMP 07/05/2010 Physical Exam  Constitutional: No distress.  Smells of cigarette smoke.  HENT:  Head: Normocephalic and atraumatic.  Right Ear: External ear normal.  Left Ear: External ear normal.  Mouth/Throat: No oropharyngeal exudate.  Eyes:  Conjunctivae are normal. Right eye exhibits no discharge. Left eye exhibits no discharge. No scleral icterus.  Neck: Neck supple. No tracheal deviation present.  Cardiovascular: Normal rate, regular rhythm and intact distal pulses.   Pulmonary/Chest: Accessory muscle usage present. No stridor. No respiratory distress. She has decreased breath sounds. She has wheezes. She has rhonchi. She has no rales.  Abdominal: Soft. Bowel sounds are normal. She exhibits no distension. There is no tenderness. There is no rebound and no guarding.  Musculoskeletal: She exhibits no edema or tenderness.  Neurological: She is alert. She has normal strength. No cranial nerve deficit (no facial droop, extraocular movements intact, no slurred speech) or sensory deficit. She exhibits normal muscle tone. She displays no seizure activity. Coordination normal.  Skin: Skin is warm and dry. No rash noted. She is not diaphoretic.  Psychiatric: She has a normal mood and affect.  Nursing note and vitals reviewed.   ED Course  Procedures  DIAGNOSTIC STUDIES: Oxygen Saturation is 88% on [place], low by my interpretation.    COORDINATION OF CARE: 4:03 PM-Discussed treatment plan which includes labs, CXR, and breathing treatment with pt at bedside and pt agreed to plan.   Labs Review Labs Reviewed  BASIC METABOLIC PANEL - Abnormal; Notable for the following:    CO2 34 (*)    All other components within normal limits  CBC - Abnormal; Notable for the following:    RBC 3.85 (*)    Hemoglobin 11.8 (*)    RDW 16.6 (*)    All other components within normal limits    Imaging Review Dg Chest 2 View  06/01/2014   CLINICAL DATA:  Shortness of breath and weakness.  EXAM: CHEST  2 VIEW  COMPARISON:  04/28/2013, 05/26/2012.  FINDINGS: The lungs remain hyperinflated with emphysema. Multiple bilateral calcified granuloma are again seen. Mild cardiomegaly is stable from prior. There is no pulmonary edema. No confluent airspace disease. No  pleural effusion or pneumothorax. No acute osseous abnormality is seen.  IMPRESSION: Stable hyperinflation and emphysema. Unchanged sequela of prior granulomatous disease. No acute pulmonary process.   Electronically Signed   By: Rubye Oaks M.D.   On: 06/01/2014 18:26    Medications  ipratropium-albuterol (DUONEB) 0.5-2.5 (3) MG/3ML nebulizer solution 3 mL (3 mLs Nebulization Given 06/01/14 1610)  albuterol (PROVENTIL,VENTOLIN) solution continuous neb (10 mg/hr Nebulization Given 06/01/14 1615)  methylPREDNISolone sodium succinate (SOLU-MEDROL) 125 mg/2 mL injection 125 mg (125 mg Intravenous Given 06/01/14 1629)  albuterol (PROVENTIL) (2.5 MG/3ML) 0.083% nebulizer solution 5 mg (5 mg Nebulization Given 06/01/14 1851)    MDM   Final diagnoses:  COPD exacerbation    1940  Pt has had several breathing treatments and IV steroids.   On repeat exam she still has decreased air movement and labored breathing.  Pt has not had to use her oxygen for a long period  of time.  Does not quite feel well enough to go home.  Will consult with hospitalist.  I personally performed the services described in this documentation, which was scribed in my presence.  The recorded information has been reviewed and is accurate.    Linwood DibblesJon Attila Mccarthy, MD 06/01/14 [phone]

## 2014-06-01 NOTE — ED Notes (Signed)
Feels sob, weak no fever. Body aches

## 2014-06-01 NOTE — H&P (Signed)
Hospitalist Admission History and Physical  Patient name: Margaret Sheppard Medical record number: [id number] Date of birth: [birth year] Age: 49 y.o. Gender: female  Primary Care Provider: No PCP Per Patient  Chief Complaint: acute resp failure w/ hypoxia, COPD   History of Present Illness:This is a 49 y.o. year old female with significant past medical history of COPD, tobacco abuse, ? Chronic resp failure-previously on home O2, bipolar d/o, HTN presenting with acute resp failure w/ hypoxia, COPD. Pt reports general malaise, fatigue, cough, increased sputum production and color change over past 2-3 weeks, wheezing. Still smoking despite sxs. Has had subjective chills at home. No CP. No nausea, vomiting, diarrhea. No known sick contacts. Progressive worsening albuterol needs at home. States that she was previously on home O2 related to last hospitalization 2014. States that she was would wear continuously on irregular basis. Stopped wearing home O2 about 6 months ago because of symptomatic improvement. States that she tried to establish w/ PCP today, but was unable to be seen.  Presented to [facility] ER T 98, HR 90s-110s, resp 10s, BP 110s-130s, Satting 88% on RA-> 92% on 2L [place]. CBC and BMET grossly WNL apart from hgb 11.8. CXR shows chronic emphysema, no acute abnormalities. Given CAT in ER w/ minimal improvement in sxs.  Assessment and Plan: Margaret Sheppard is a 49 y.o. year old female presenting with acute resp failure w/ hypoxia    Active Problems:   Acute respiratory failure with hypoxia   1- Acute resp failure w/ hypoxia -likely secondary to COPD  -IV solumedrol  -duonebs -IV levaquin  -supplemental O2 -baseline ABG  -resp status fairly stable currently. No increased WOB, resp distress. Able to speak in full sentences.   2- Hypothyroidism -cont synthroid   3- mood  -stable  -cont trazodone   4- HTN -BP stable  -cont home regimen   FEN/GI: heart healthy diet  Prophylaxis: sub q  heparin  Disposition: pending further evaluation  Code Status:Full Code    Patient Active Problem List   Diagnosis Date Noted  . COPD (chronic obstructive pulmonary disease) 04/28/2013  . Acute exacerbation of chronic obstructive pulmonary disease (COPD) 04/28/2013  . Unspecified hypothyroidism 04/28/2013  . Acute respiratory failure with hypoxia 04/28/2013  . COPD exacerbation 06/05/2012  . Tobacco abuse 06/05/2012  . HTN (hypertension) 06/05/2012  . Bipolar 1 disorder 06/05/2012   Past Medical History: Past Medical History  Diagnosis Date  . Hypertension   . Bipolar 1 disorder   . Asthma   . COPD (chronic obstructive pulmonary disease)     Past Surgical History: Past Surgical History  Procedure Laterality Date  . Tubal ligation    . Cesarean section      Social History: History   Social History  . Marital Status: Widowed    Spouse Name: N/A    Number of Children: N/A  . Years of Education: N/A   Social History Main Topics  . Smoking status: Current Every Day Smoker -- 0.50 packs/day    Types: Cigarettes  . Smokeless tobacco: None  . Alcohol Use: No     Comment: occ  . Drug Use: No  . Sexual Activity: Yes    Birth Control/ Protection: Surgical   Other Topics Concern  . None   Social History Narrative    Family History: History reviewed. No pertinent family history.  Allergies: Allergies  Allergen Reactions  . Lithium Other (See Comments)    Pt states it makes her kidney's hurt  .  Sulfa Antibiotics Hives  . Penicillins Hives, Nausea And Vomiting and Rash    Current Facility-Administered Medications  Medication Dose Route Frequency Provider Last Rate Last Dose  . 0.9 %  sodium chloride infusion   Intravenous Continuous Doree AlbeeSteven Cathrine Krizan, MD      . heparin injection 5,000 Units  5,000 Units Subcutaneous 3 times per day Doree AlbeeSteven Kamrin Sibley, MD      . ipratropium-albuterol (DUONEB) 0.5-2.5 (3) MG/3ML nebulizer solution 3 mL  3 mL Nebulization Q2H Doree AlbeeSteven  Rhiley Solem, MD      . ipratropium-albuterol (DUONEB) 0.5-2.5 (3) MG/3ML nebulizer solution 3 mL  3 mL Nebulization Q1H PRN Doree AlbeeSteven Dandrea Medders, MD      . levofloxacin (LEVAQUIN) IVPB 750 mg  750 mg Intravenous Q24H Doree AlbeeSteven Jaydalee Bardwell, MD      . methylPREDNISolone sodium succinate (SOLU-MEDROL) 125 mg/2 mL injection 125 mg  125 mg Intravenous 3 times per day Doree AlbeeSteven Dashayla Theissen, MD       Current Outpatient Prescriptions  Medication Sig Dispense Refill  . Aspirin-Salicylamide-Caffeine (BC HEADACHE) 325-95-16 MG TABS Take 2 packets by mouth daily as needed (FOR PAIN).    . Cyanocobalamin (VITAMIN B-12) 5000 MCG SUBL Place 1-2 tablets under the tongue daily.     Marland Kitchen. gabapentin (NEURONTIN) 400 MG capsule Take 400-800 mg by mouth 2 (two) times daily. TAKES 400MG  DAILY AND 800MG  AT BEDTIME    . guaiFENesin (MUCINEX) 600 MG 12 hr tablet Take 600 mg by mouth every 12 (twelve) hours.    . hydroxypropyl methylcellulose (ISOPTO TEARS) 2.5 % ophthalmic solution Place 1 drop into both eyes 3 (three) times daily as needed. For dry eyes/contacts    . Ipratropium-Albuterol (COMBIVENT) 20-100 MCG/ACT AERS respimat Inhale 1 puff into the lungs 4 (four) times daily. 1 Inhaler 0  . levothyroxine (SYNTHROID, LEVOTHROID) 112 MCG tablet Take 112 mcg by mouth daily before breakfast.    . traZODone (DESYREL) 50 MG tablet Take 50-150 mg by mouth at bedtime.    Marland Kitchen. albuterol (PROVENTIL HFA) 108 (90 BASE) MCG/ACT inhaler Inhale 2 puffs into the lungs every 6 (six) hours as needed for wheezing or shortness of breath. For shortness of breath 1 Inhaler 0  . nicotine (NICODERM CQ - DOSED IN MG/24 HOURS) 14 mg/24hr patch Place 1 patch (14 mg total) onto the skin daily. (Patient not taking: Reported on 06/01/2014) 28 patch 0  . predniSONE (DELTASONE) 10 MG tablet 12/13-12/15: Take 40 mg by mouth daily. 12/16-12/18: Take 20 mg by mouth daily. 12/19-12/21: Take 10 mg by mouth daily then stop. (Patient not taking: Reported on 06/01/2014) 21 tablet 0   Review Of  Systems: 12 point ROS negative except as noted above in HPI.  Physical Exam: Filed Vitals:   06/01/14 1921  BP: 112/71  Pulse: 94  Temp:   Resp: 15    General: alert and cooperative HEENT: PERRLA and extra ocular movement intact Heart: S1, S2 normal, no murmur, rub or gallop, regular rate and rhythm Lungs: unlabored breathing and expiratory wheezes Abdomen: abdomen is soft without significant tenderness, masses, organomegaly or guarding Extremities: extremities normal, atraumatic, no cyanosis or edema Skin:no rashes Neurology: normal without focal findings  Labs and Imaging: Lab Results  Component Value Date/Time   NA 143 06/01/2014 04:06 PM   K 4.2 06/01/2014 04:06 PM   CL 104 06/01/2014 04:06 PM   CO2 34* 06/01/2014 04:06 PM   BUN 7 06/01/2014 04:06 PM   CREATININE 0.61 06/01/2014 04:06 PM   GLUCOSE 91 06/01/2014 04:06 PM  Lab Results  Component Value Date   WBC 9.1 06/01/2014   HGB 11.8* 06/01/2014   HCT 37.6 06/01/2014   MCV 97.7 06/01/2014   PLT 262 06/01/2014    Dg Chest 2 View  06/01/2014   CLINICAL DATA:  Shortness of breath and weakness.  EXAM: CHEST  2 VIEW  COMPARISON:  04/28/2013, 05/26/2012.  FINDINGS: The lungs remain hyperinflated with emphysema. Multiple bilateral calcified granuloma are again seen. Mild cardiomegaly is stable from prior. There is no pulmonary edema. No confluent airspace disease. No pleural effusion or pneumothorax. No acute osseous abnormality is seen.  IMPRESSION: Stable hyperinflation and emphysema. Unchanged sequela of prior granulomatous disease. No acute pulmonary process.   Electronically Signed   By: Rubye Oaks M.D.   On: 06/01/2014 18:26           Doree Albee MD  Pager: [phone]

## 2014-06-01 NOTE — ED Notes (Signed)
MD at bedside. 

## 2014-06-01 NOTE — ED Notes (Signed)
RT called

## 2014-06-02 DIAGNOSIS — Z720 Tobacco use: Secondary | ICD-10-CM | POA: Diagnosis not present

## 2014-06-02 DIAGNOSIS — E039 Hypothyroidism, unspecified: Secondary | ICD-10-CM

## 2014-06-02 DIAGNOSIS — F17210 Nicotine dependence, cigarettes, uncomplicated: Secondary | ICD-10-CM | POA: Diagnosis present

## 2014-06-02 DIAGNOSIS — J9621 Acute and chronic respiratory failure with hypoxia: Secondary | ICD-10-CM | POA: Diagnosis not present

## 2014-06-02 DIAGNOSIS — Z888 Allergy status to other drugs, medicaments and biological substances status: Secondary | ICD-10-CM | POA: Diagnosis not present

## 2014-06-02 DIAGNOSIS — Z9981 Dependence on supplemental oxygen: Secondary | ICD-10-CM | POA: Diagnosis not present

## 2014-06-02 DIAGNOSIS — Z880 Allergy status to penicillin: Secondary | ICD-10-CM | POA: Diagnosis not present

## 2014-06-02 DIAGNOSIS — J9601 Acute respiratory failure with hypoxia: Secondary | ICD-10-CM | POA: Diagnosis present

## 2014-06-02 DIAGNOSIS — F319 Bipolar disorder, unspecified: Secondary | ICD-10-CM | POA: Diagnosis present

## 2014-06-02 DIAGNOSIS — Z9851 Tubal ligation status: Secondary | ICD-10-CM | POA: Diagnosis not present

## 2014-06-02 DIAGNOSIS — T380X5A Adverse effect of glucocorticoids and synthetic analogues, initial encounter: Secondary | ICD-10-CM | POA: Diagnosis present

## 2014-06-02 DIAGNOSIS — R739 Hyperglycemia, unspecified: Secondary | ICD-10-CM | POA: Diagnosis present

## 2014-06-02 DIAGNOSIS — J45909 Unspecified asthma, uncomplicated: Secondary | ICD-10-CM | POA: Diagnosis present

## 2014-06-02 DIAGNOSIS — J441 Chronic obstructive pulmonary disease with (acute) exacerbation: Secondary | ICD-10-CM | POA: Diagnosis not present

## 2014-06-02 DIAGNOSIS — Z882 Allergy status to sulfonamides status: Secondary | ICD-10-CM | POA: Diagnosis not present

## 2014-06-02 DIAGNOSIS — I10 Essential (primary) hypertension: Secondary | ICD-10-CM | POA: Diagnosis present

## 2014-06-02 DIAGNOSIS — Z9119 Patient's noncompliance with other medical treatment and regimen: Secondary | ICD-10-CM | POA: Diagnosis present

## 2014-06-02 LAB — CBC WITH DIFFERENTIAL/PLATELET
BASOS ABS: 0 10*3/uL (ref 0.0–0.1)
BASOS PCT: 0 % (ref 0–1)
EOS PCT: 0 % (ref 0–5)
Eosinophils Absolute: 0 10*3/uL (ref 0.0–0.7)
HCT: 36.9 % (ref 36.0–46.0)
HEMOGLOBIN: 11.3 g/dL — AB (ref 12.0–15.0)
LYMPHS ABS: 0.3 10*3/uL — AB (ref 0.7–4.0)
Lymphocytes Relative: 4 % — ABNORMAL LOW (ref 12–46)
MCH: 30.1 pg (ref 26.0–34.0)
MCHC: 30.6 g/dL (ref 30.0–36.0)
MCV: 98.4 fL (ref 78.0–100.0)
Monocytes Absolute: 0 10*3/uL — ABNORMAL LOW (ref 0.1–1.0)
Monocytes Relative: 0 % — ABNORMAL LOW (ref 3–12)
Neutro Abs: 6.9 10*3/uL (ref 1.7–7.7)
Neutrophils Relative %: 96 % — ABNORMAL HIGH (ref 43–77)
PLATELETS: 233 10*3/uL (ref 150–400)
RBC: 3.75 MIL/uL — AB (ref 3.87–5.11)
RDW: 16.7 % — ABNORMAL HIGH (ref 11.5–15.5)
WBC: 7.2 10*3/uL (ref 4.0–10.5)

## 2014-06-02 LAB — COMPREHENSIVE METABOLIC PANEL
ALK PHOS: 80 U/L (ref 39–117)
ALT: 13 U/L (ref 0–35)
AST: 12 U/L (ref 0–37)
Albumin: 2.9 g/dL — ABNORMAL LOW (ref 3.5–5.2)
Anion gap: 6 (ref 5–15)
BUN: 9 mg/dL (ref 6–23)
CO2: 32 mmol/L (ref 19–32)
Calcium: 8.4 mg/dL (ref 8.4–10.5)
Chloride: 103 mEq/L (ref 96–112)
Creatinine, Ser: 0.49 mg/dL — ABNORMAL LOW (ref 0.50–1.10)
GFR calc non Af Amer: >90 mL/min (ref >90)
GLUCOSE: 187 mg/dL — AB (ref 70–99)
POTASSIUM: 4.5 mmol/L (ref 3.5–5.1)
Sodium: 141 mmol/L (ref 135–145)
Total Bilirubin: 0.2 mg/dL — ABNORMAL LOW (ref 0.3–1.2)
Total Protein: 6.4 g/dL (ref 6.0–8.3)

## 2014-06-02 LAB — GLUCOSE, CAPILLARY
GLUCOSE-CAPILLARY: 148 mg/dL — AB (ref 70–99)
Glucose-Capillary: 231 mg/dL — ABNORMAL HIGH (ref 70–99)
Glucose-Capillary: 185 mg/dL — ABNORMAL HIGH (ref 70–99)
Glucose-Capillary: 162 mg/dL — ABNORMAL HIGH (ref 70–99)

## 2014-06-02 LAB — TSH: TSH: 0.336 u[IU]/mL — ABNORMAL LOW (ref 0.350–4.500)

## 2014-06-02 MED ORDER — INSULIN ASPART 100 UNIT/ML ~~LOC~~ SOLN: 0.0000 [IU] | Freq: Every day | SUBCUTANEOUS | Status: DC

## 2014-06-02 MED ORDER — BUDESONIDE-FORMOTEROL FUMARATE 160-4.5 MCG/ACT IN AERO
2.0000 | INHALATION_SPRAY | Freq: Two times a day (BID) | RESPIRATORY_TRACT | Status: DC
  Administered 2014-06-02 – 2014-06-03 (×3): 2 via RESPIRATORY_TRACT
  Filled 2014-06-02: qty 6

## 2014-06-02 MED ORDER — INSULIN ASPART 100 UNIT/ML ~~LOC~~ SOLN
0.0000 [IU] | Freq: Three times a day (TID) | SUBCUTANEOUS | Status: DC
Start: 2014-06-02 — End: 2014-06-03
  Administered 2014-06-02: 3 [IU] via SUBCUTANEOUS
  Administered 2014-06-02: 4 [IU] via SUBCUTANEOUS
  Administered 2014-06-02: 7 [IU] via SUBCUTANEOUS
  Administered 2014-06-03: 4 [IU] via SUBCUTANEOUS

## 2014-06-02 MED ORDER — FAMOTIDINE 20 MG PO TABS
20.0000 mg | ORAL_TABLET | Freq: Every day | ORAL | Status: DC
  Administered 2014-06-02 – 2014-06-03 (×2): 20 mg via ORAL
  Filled 2014-06-02 (×2): qty 1

## 2014-06-02 MED ORDER — METHYLPREDNISOLONE SODIUM SUCC 125 MG IJ SOLR
80.0000 mg | Freq: Three times a day (TID) | INTRAMUSCULAR | Status: DC
  Administered 2014-06-02 – 2014-06-03 (×3): 80 mg via INTRAVENOUS
  Filled 2014-06-02 (×3): qty 2

## 2014-06-02 MED ORDER — BENZONATATE 100 MG PO CAPS
100.0000 mg | ORAL_CAPSULE | Freq: Three times a day (TID) | ORAL | Status: DC
  Administered 2014-06-02 – 2014-06-03 (×4): 100 mg via ORAL
  Filled 2014-06-02 (×4): qty 1

## 2014-06-02 MED ORDER — HYDROCOD POLST-CHLORPHEN POLST 10-8 MG/5ML PO LQCR: 5.0000 mL | Freq: Two times a day (BID) | ORAL | Status: DC | PRN

## 2014-06-02 NOTE — Care Management Note (Addendum)
    Page 1 of 2   06/03/2014     11:56:05 AM CARE MANAGEMENT NOTE 06/03/2014  Patient:  Margaret BolusMITCHELL,Collin C   Account Number:  [id number]  Date Initiated:  06/02/2014  Documentation initiated by:  Kathyrn SheriffHILDRESS,JESSICA  Subjective/Objective Assessment:   Pt admitted for COPD. Pt is from home, lives with spouse and independent with ADL's prior to admission. [facility] no [facility]. Pt thinks she will use it agian once discharged.     Action/Plan:   Pt has port tank to get home with. Pt previously went health department but now has medicaid and is looking for PCP. Pt given information for two area PCP's accepting pt's. Pt also followed by mental health MD.   Anticipated DC Date:  06/02/2014   Anticipated DC Plan:  HOME/SELF CARE      DC Planning Services  CM consult      PAC Choice  Resumption Of Svcs/PTA Latisia Hilaire   Choice offered to / List presented to:     DME arranged  [facility]      DME agency  [facility]        Status of service:  Completed, signed off Medicare Important Message given?   (If response is "NO", the following Medicare IM given date fields will be blank) Date Medicare IM given:   Medicare IM given by:   Date Additional Medicare IM given:   Additional Medicare IM given by:    Discharge Disposition:  HOME/SELF CARE  Per UR Regulation:    If discussed at Long Length of Stay Meetings, dates discussed:    Comments:  06/03/2014 1145 Kathyrn SheriffJessica Childress, RN, MSN, PCCN Pt being dishcarge home with self care. Pt has home O2 but has not used it in a while and would like someone to come explain how to use the equiptment again. Pt also needs a port tank to get home with. [facility] and will deliver port tank and send someone to reiterate how to use equiptment. No further CM needs at this time.  06/02/2014 1130 Kathyrn SheriffJessica Childress, RN, MSN, PCCN Pt plans to dishcarge home with self care. Will continue  to follow for CM needs.

## 2014-06-02 NOTE — Progress Notes (Signed)
UR completed 

## 2014-06-02 NOTE — Progress Notes (Signed)
[facility] PROGRESS NOTE  Margaret Sheppard ZOX:[id number] DOB: [birth year] DOA: 06/01/2014 PCP: No PCP Per Patient    Code Status: Full code  Family Communication: discussed with patient; family not available  Disposition Plan: discharge to home clinically appropriate   Consultants:  None  Procedures:  None  Antibiotics:  IV Levaquin 06/01/14 >  HPI/Subjective: Patient is still short of breath and has chest congestion, but she feels slightly better than last night. No complaints of chest pain.  Objective: Filed Vitals:   06/02/14 0800  BP: 99/53  Pulse: 91  Temp:   Resp: 14   temperature 97.9. Pulse 91. Respiratory rate 14. Blood pressure 99/53. Oxygen saturation 100% on supplemental oxygen.  Intake/Output Summary (Last 24 hours) at 06/02/14 1124 Last data filed at 06/02/14 0800  Gross per 24 hour  Intake    970 ml  Output      0 ml  Net    970 ml   Filed Weights   06/01/14 1546 06/01/14 2054  Weight: 56.246 kg (124 lb) 58.7 kg (129 lb 6.6 oz)    Exam:   General:  49 year old woman laying in bed, in no acute distress.  Cardiovascular: S1, S2, with a soft systolic murmur.  Respiratory: bilateral wheezes and occasional crackles. Breathing nonlabored at rest.  Abdomen: positive bowel sounds, soft, nontender, nondistended.  Musculoskeletal: no pedal edema. No acute hot red joints. Pedal pulses palpable.  Psychiatric/neurologic: She is alert and oriented 3. Her speech is clear. Cranial nerves II through XII are intact.   Data Reviewed: Basic Metabolic Panel:  Recent Labs Lab 06/01/14 1606 06/02/14 0554  NA 143 141  K 4.2 4.5  CL 104 103  CO2 34* 32  GLUCOSE 91 187*  BUN 7 9  CREATININE 0.61 0.49*  CALCIUM 8.7 8.4   Liver Function Tests:  Recent Labs Lab 06/02/14 0554  AST 12  ALT 13  ALKPHOS 80  BILITOT 0.2*  PROT 6.4  ALBUMIN 2.9*   No results for input(s): LIPASE, AMYLASE in the last 168 hours. No results for input(s):  AMMONIA in the last 168 hours. CBC:  Recent Labs Lab 06/01/14 1606 06/02/14 0554  WBC 9.1 7.2  NEUTROABS  --  6.9  HGB 11.8* 11.3*  HCT 37.6 36.9  MCV 97.7 98.4  PLT 262 233   Cardiac Enzymes: No results for input(s): CKTOTAL, CKMB, CKMBINDEX, TROPONINI in the last 168 hours. BNP (last 3 results) No results for input(s): PROBNP in the last 8760 hours. CBG:  Recent Labs Lab 06/02/14 0855  GLUCAP 148*    Recent Results (from the past 240 hour(s))  MRSA PCR Screening     Status: None   Collection Time: 06/01/14  8:45 PM  Result Value Ref Range Status   MRSA by PCR NEGATIVE NEGATIVE Final    Comment:        The GeneXpert MRSA Assay (FDA approved for NASAL specimens only), is one component of a comprehensive MRSA colonization surveillance program. It is not intended to diagnose MRSA infection nor to guide or monitor treatment for MRSA infections.      Studies: Dg Chest 2 View  06/01/2014   CLINICAL DATA:  Shortness of breath and weakness.  EXAM: CHEST  2 VIEW  COMPARISON:  04/28/2013, 05/26/2012.  FINDINGS: The lungs remain hyperinflated with emphysema. Multiple bilateral calcified granuloma are again seen. Mild cardiomegaly is stable from prior. There is no pulmonary edema. No confluent airspace disease. No pleural effusion or pneumothorax. No acute osseous  abnormality is seen.  IMPRESSION: Stable hyperinflation and emphysema. Unchanged sequela of prior granulomatous disease. No acute pulmonary process.   Electronically Signed   By: Rubye Oaks M.D.   On: 06/01/2014 18:26    Scheduled Meds: . benzonatate  100 mg Oral TID  . budesonide-formoterol  2 puff Inhalation BID  . famotidine  20 mg Oral Daily  . gabapentin  800 mg Oral QHS   And  . gabapentin  400 mg Oral Daily  . heparin  5,000 Units Subcutaneous 3 times per day  . insulin aspart  0-20 Units Subcutaneous TID WC  . insulin aspart  0-5 Units Subcutaneous QHS  . ipratropium-albuterol  3 mL Nebulization  Q4H  . levofloxacin (LEVAQUIN) IV  750 mg Intravenous Q24H  . levothyroxine  112 mcg Oral QAC breakfast  . methylPREDNISolone (SOLU-MEDROL) injection  125 mg Intravenous 3 times per day  . nicotine  21 mg Transdermal Daily  . traZODone  50-150 mg Oral QHS   Continuous Infusions: . sodium chloride 75 mL/hr at 06/02/14 0800   Assessment and plan:  Principal Problem:   Acute exacerbation of chronic obstructive pulmonary disease (COPD) Active Problems:   Acute on chronic respiratory failure with hypoxia   Tobacco abuse   HTN (hypertension)   Bipolar 1 disorder   Hypothyroidism   Steroid-induced hyperglycemia    1. Acute respiratory failure with hypoxia secondary to COPD exacerbation. The patient was noncompliant with oxygen therapy at home-she stated that she took herself off of oxygen approximately 9 months ago when she did not think she needed it. ABG on admission noted for a PO2 of 57.4. Currently, her oxygen saturation is 100% on 2 L of nasal cannula oxygen. Will titrate oxygen to keep her oxygen saturations at a minimum of 90%. We'll continue IV Solu-Medrol, but will reduce the dose. Continue IV Levaquin and DuoNeb. Have added Symbicort inhaler and Tessalon Perles and when necessary Tussionex.  Tobacco abuse. The patient was advised to stop smoking. Continue nicotine replacement therapy with nicotine patch.  Bipolar disorder. Currently stable. Continue trazodone and Neurontin.  Hypothyroidism. We'll continue Synthroid. Will order a TSH for evaluation.  Steroid-induced hyperglycemia. Have added sliding scale NovoLog. Continue to monitor CBGs.  Time spent: 35 minutes.    [facility]  [facility] Pager [phone]. If 7PM-7AM, please contact night-coverage at [email or web address], password [facility] 06/02/2014, 11:24 AM  LOS: 1 day

## 2014-06-03 LAB — GLUCOSE, CAPILLARY: GLUCOSE-CAPILLARY: 153 mg/dL — AB (ref 70–99)

## 2014-06-03 LAB — BASIC METABOLIC PANEL
BUN: 14 mg/dL (ref 6–23)
CHLORIDE: 103 meq/L (ref 96–112)
CO2: 35 mmol/L — AB (ref 19–32)
Calcium: 8.4 mg/dL (ref 8.4–10.5)
Creatinine, Ser: 0.44 mg/dL — ABNORMAL LOW (ref 0.50–1.10)
GFR calc non Af Amer: >90 mL/min (ref >90)
Glucose, Bld: 159 mg/dL — ABNORMAL HIGH (ref 70–99)
POTASSIUM: 5.1 mmol/L (ref 3.5–5.1)
SODIUM: 140 mmol/L (ref 135–145)

## 2014-06-03 LAB — CBC
HCT: 35 % — ABNORMAL LOW (ref 36.0–46.0)
Hemoglobin: 10.9 g/dL — ABNORMAL LOW (ref 12.0–15.0)
MCH: 31.3 pg (ref 26.0–34.0)
MCHC: 31.1 g/dL (ref 30.0–36.0)
MCV: 100.6 fL — ABNORMAL HIGH (ref 78.0–100.0)
PLATELETS: 242 10*3/uL (ref 150–400)
RBC: 3.48 MIL/uL — AB (ref 3.87–5.11)
RDW: 17 % — ABNORMAL HIGH (ref 11.5–15.5)
WBC: 9.6 10*3/uL (ref 4.0–10.5)

## 2014-06-03 MED ORDER — TIOTROPIUM BROMIDE MONOHYDRATE 18 MCG IN CAPS
18.0000 ug | ORAL_CAPSULE | Freq: Every day | RESPIRATORY_TRACT | Status: AC
Start: 2014-06-03 — End: ?

## 2014-06-03 MED ORDER — PREDNISONE 10 MG PO TABS: 10.0000 mg | ORAL_TABLET | Freq: Every day | ORAL | Status: DC

## 2014-06-03 MED ORDER — LEVOFLOXACIN 750 MG PO TABS: 750.0000 mg | ORAL_TABLET | Freq: Every day | ORAL | Status: DC

## 2014-06-03 MED ORDER — BUDESONIDE-FORMOTEROL FUMARATE 160-4.5 MCG/ACT IN AERO: 2.0000 | INHALATION_SPRAY | Freq: Two times a day (BID) | RESPIRATORY_TRACT | Status: DC

## 2014-06-03 MED ORDER — ALBUTEROL SULFATE HFA 108 (90 BASE) MCG/ACT IN AERS: 2.0000 | INHALATION_SPRAY | RESPIRATORY_TRACT | Status: AC | PRN

## 2014-06-03 MED ORDER — NICOTINE 21 MG/24HR TD PT24: 21.0000 mg | MEDICATED_PATCH | Freq: Every day | TRANSDERMAL | Status: DC

## 2014-06-03 NOTE — Progress Notes (Signed)
D.c instructions reviewed with patient.  Verbalized understanding.  Pt dc'd to home with family. Schonewitz, Candelaria StagersLeigh Anne 06/03/2014

## 2014-06-03 NOTE — Discharge Summary (Signed)
Physician Discharge Summary  Margaret Sheppard:[id number] DOB: 1965/12/17 DOA: 06/01/2014  PCP: No PCP Per Patient  Admit date: 06/01/2014 Discharge date: 06/03/2014  Time spent: 45 minutes  Recommendations for Outpatient Follow-up:  -Will DC home today. -Advised to follow up with new PCP in 2 weeks.   Discharge Diagnoses:  Principal Problem:   Acute exacerbation of chronic obstructive pulmonary disease (COPD) Active Problems:   Tobacco abuse   HTN (hypertension)   Bipolar 1 disorder   Hypothyroidism   Acute on chronic respiratory failure with hypoxia   Steroid-induced hyperglycemia   Acute respiratory failure with hypoxia   Discharge Condition: Stable and improved  Filed Weights   06/01/14 1546 06/01/14 2054 06/03/14 0500  Weight: 56.246 kg (124 lb) 58.7 kg (129 lb 6.6 oz) 63 kg (138 lb 14.2 oz)    History of present illness:  This is a 49 y.o. year old female with significant past medical history of COPD, tobacco abuse, ? Chronic resp failure-previously on home O2, bipolar d/o, HTN presenting with acute resp failure w/ hypoxia, COPD. Pt reports general malaise, fatigue, cough, increased sputum production and color change over past 2-3 weeks, wheezing. Still smoking despite sxs. Has had subjective chills at home. No CP. No nausea, vomiting, diarrhea. No known sick contacts. Progressive worsening albuterol needs at home. States that she was previously on home O2 related to last hospitalization 2014. States that she was would wear continuously on irregular basis. Stopped wearing home O2 about 6 months ago because of symptomatic improvement. States that she tried to establish w/ PCP today, but was unable to be seen.  Presented to [facility] ER T 98, HR 90s-110s, resp 10s, BP 110s-130s, Satting 88% on RA-> 92% on 2L [place]. CBC and BMET grossly WNL apart from hgb 11.8. CXR shows chronic emphysema, no acute abnormalities. Given CAT in ER w/ minimal improvement in sxs.   Hospital Course:     Acute on Chronic Hypoxemic Respiratory Failure -2/2 COPD with acute Exacerbation -Will need continued oxygen on DC -Please see below for details.  COPD with Acute Exacerbation -Improved. -Will DC today on a prednisone taper, symbicort, spiriva, levaquin for 5 days, as well as oxygen at 2 L continuously.   Tobacco Abuse -interested in quitting. -Wants to try nicotine patches, will prescribe.  Hypothyroidism -Continue synthroid.  Procedures:  None   Consultations:  None  Discharge Instructions  Discharge Instructions    Increase activity slowly    Complete by:  As directed             Medication List    STOP taking these medications        guaiFENesin 600 MG 12 hr tablet  Commonly known as:  MUCINEX     Ipratropium-Albuterol 20-100 MCG/ACT Aers respimat  Commonly known as:  COMBIVENT     nicotine 14 mg/24hr patch  Commonly known as:  NICODERM CQ - dosed in mg/24 hours  Replaced by:  nicotine 21 mg/24hr patch      TAKE these medications        albuterol 108 (90 BASE) MCG/ACT inhaler  Commonly known as:  PROVENTIL HFA  Inhale 2 puffs into the lungs every 4 (four) hours as needed for wheezing or shortness of breath. For shortness of breath     BC HEADACHE 325-95-16 MG Tabs  Generic drug:  Aspirin-Salicylamide-Caffeine  Take 2 packets by mouth daily as needed (FOR PAIN).     budesonide-formoterol 160-4.5 MCG/ACT inhaler  Commonly known as:  SYMBICORT  Inhale 2 puffs into the lungs 2 (two) times daily.     gabapentin 400 MG capsule  Commonly known as:  NEURONTIN  Take 400-800 mg by mouth 2 (two) times daily. TAKES 400MG  DAILY AND 800MG  AT BEDTIME     hydroxypropyl methylcellulose / hypromellose 2.5 % ophthalmic solution  Commonly known as:  ISOPTO TEARS / GONIOVISC  Place 1 drop into both eyes 3 (three) times daily as needed. For dry eyes/contacts     levofloxacin 750 MG tablet  Commonly known as:  LEVAQUIN  Take 1 tablet (750 mg total) by mouth  daily.     levothyroxine 112 MCG tablet  Commonly known as:  SYNTHROID, LEVOTHROID  Take 112 mcg by mouth daily before breakfast.     nicotine 21 mg/24hr patch  Commonly known as:  NICODERM CQ - dosed in mg/24 hours  Place 1 patch (21 mg total) onto the skin daily.     predniSONE 10 MG tablet  Commonly known as:  DELTASONE  Take 1 tablet (10 mg total) by mouth daily with breakfast. Take 6 tablets today and then decrease by 1 tablet daily until none are left.     tiotropium 18 MCG inhalation capsule  Commonly known as:  SPIRIVA HANDIHALER  Place 1 capsule (18 mcg total) into inhaler and inhale daily.     traZODone 50 MG tablet  Commonly known as:  DESYREL  Take 50-150 mg by mouth at bedtime.     Vitamin B-12 5000 MCG Subl  Place 1-2 tablets under the tongue daily.       Allergies  Allergen Reactions  . Lithium Other (See Comments)    Pt states it makes her kidney's hurt  . Sulfa Antibiotics Hives  . Penicillins Hives, Nausea And Vomiting and Rash       Follow-up Information    Follow up with No PCP Per Patient In 2 weeks.   Specialty:  General Practice   Why:  with your new physician in 2 weeks       The results of significant diagnostics from this hospitalization (including imaging, microbiology, ancillary and laboratory) are listed below for reference.    Significant Diagnostic Studies: Dg Chest 2 View  06/01/2014   CLINICAL DATA:  Shortness of breath and weakness.  EXAM: CHEST  2 VIEW  COMPARISON:  04/28/2013, 05/26/2012.  FINDINGS: The lungs remain hyperinflated with emphysema. Multiple bilateral calcified granuloma are again seen. Mild cardiomegaly is stable from prior. There is no pulmonary edema. No confluent airspace disease. No pleural effusion or pneumothorax. No acute osseous abnormality is seen.  IMPRESSION: Stable hyperinflation and emphysema. Unchanged sequela of prior granulomatous disease. No acute pulmonary process.   Electronically Signed   By: Rubye OaksMelanie   Ehinger M.D.   On: 06/01/2014 18:26    Microbiology: Recent Results (from the past 240 hour(s))  MRSA PCR Screening     Status: None   Collection Time: 06/01/14  8:45 PM  Result Value Ref Range Status   MRSA by PCR NEGATIVE NEGATIVE Final    Comment:        The GeneXpert MRSA Assay (FDA approved for NASAL specimens only), is one component of a comprehensive MRSA colonization surveillance program. It is not intended to diagnose MRSA infection nor to guide or monitor treatment for MRSA infections.      Labs: Basic Metabolic Panel:  Recent Labs Lab 06/01/14 1606 06/02/14 0554 06/03/14 0445  NA 143 141 140  K 4.2 4.5 5.1  CL  104 103 103  CO2 34* 32 35*  GLUCOSE 91 187* 159*  BUN CREATININE 0.61 0.49* 0.44*  CALCIUM 8.7 8.4 8.4   Liver Function Tests:  Recent Labs Lab 06/02/14 0554  AST 12  ALT 13  ALKPHOS 80  BILITOT 0.2*  PROT 6.4  ALBUMIN 2.9*   No results for input(s): LIPASE, AMYLASE in the last 168 hours. No results for input(s): AMMONIA in the last 168 hours. CBC:  Recent Labs Lab 06/01/14 1606 06/02/14 0554 06/03/14 0445  WBC 9.1 7.2 9.6  NEUTROABS  --  6.9  --   HGB 11.8* 11.3* 10.9*  HCT 37.6 36.9 35.0*  MCV 97.7 98.4 100.6*  PLT 262 233 242   Cardiac Enzymes: No results for input(s): CKTOTAL, CKMB, CKMBINDEX, TROPONINI in the last 168 hours. BNP: BNP (last 3 results) No results for input(s): PROBNP in the last 8760 hours. CBG:  Recent Labs Lab 06/02/14 0855 06/02/14 1147 06/02/14 1658 06/02/14 2103 06/03/14 0807  GLUCAP 148* 231* 185* 162* 153*       Signed:  HERNANDEZ ACOSTA,ESTELA  [facility] Pager: [phone] 06/03/2014, 10:58 AM

## 2014-06-15 ENCOUNTER — Other Ambulatory Visit (HOSPITAL_COMMUNITY): Payer: Self-pay | Admitting: Internal Medicine

## 2014-06-15 DIAGNOSIS — J449 Chronic obstructive pulmonary disease, unspecified: Secondary | ICD-10-CM | POA: Diagnosis not present

## 2014-06-15 DIAGNOSIS — Z1231 Encounter for screening mammogram for malignant neoplasm of breast: Secondary | ICD-10-CM

## 2014-06-15 DIAGNOSIS — I10 Essential (primary) hypertension: Secondary | ICD-10-CM | POA: Diagnosis not present

## 2014-06-15 DIAGNOSIS — E039 Hypothyroidism, unspecified: Secondary | ICD-10-CM | POA: Diagnosis not present

## 2014-06-15 DIAGNOSIS — F17200 Nicotine dependence, unspecified, uncomplicated: Secondary | ICD-10-CM | POA: Diagnosis not present

## 2014-06-17 DIAGNOSIS — F319 Bipolar disorder, unspecified: Secondary | ICD-10-CM | POA: Diagnosis not present

## 2014-06-22 ENCOUNTER — Ambulatory Visit (HOSPITAL_COMMUNITY): Payer: Medicaid Other

## 2014-10-15 DIAGNOSIS — F319 Bipolar disorder, unspecified: Secondary | ICD-10-CM | POA: Diagnosis not present

## 2014-12-18 DIAGNOSIS — E039 Hypothyroidism, unspecified: Secondary | ICD-10-CM | POA: Diagnosis not present

## 2014-12-18 DIAGNOSIS — F17200 Nicotine dependence, unspecified, uncomplicated: Secondary | ICD-10-CM | POA: Diagnosis not present

## 2014-12-18 DIAGNOSIS — I10 Essential (primary) hypertension: Secondary | ICD-10-CM | POA: Diagnosis not present

## 2014-12-18 DIAGNOSIS — J449 Chronic obstructive pulmonary disease, unspecified: Secondary | ICD-10-CM | POA: Diagnosis not present

## 2014-12-18 DIAGNOSIS — F319 Bipolar disorder, unspecified: Secondary | ICD-10-CM | POA: Diagnosis not present

## 2015-01-05 DIAGNOSIS — F319 Bipolar disorder, unspecified: Secondary | ICD-10-CM | POA: Diagnosis not present

## 2015-01-19 DIAGNOSIS — Z23 Encounter for immunization: Secondary | ICD-10-CM | POA: Diagnosis not present

## 2015-01-19 DIAGNOSIS — S51812A Laceration without foreign body of left forearm, initial encounter: Secondary | ICD-10-CM | POA: Diagnosis not present

## 2015-01-19 DIAGNOSIS — J449 Chronic obstructive pulmonary disease, unspecified: Secondary | ICD-10-CM | POA: Diagnosis not present

## 2015-01-19 DIAGNOSIS — F17200 Nicotine dependence, unspecified, uncomplicated: Secondary | ICD-10-CM | POA: Diagnosis not present

## 2015-01-19 DIAGNOSIS — F319 Bipolar disorder, unspecified: Secondary | ICD-10-CM | POA: Diagnosis not present

## 2015-01-19 DIAGNOSIS — Z79899 Other long term (current) drug therapy: Secondary | ICD-10-CM | POA: Diagnosis not present

## 2015-01-19 DIAGNOSIS — W25XXXA Contact with sharp glass, initial encounter: Secondary | ICD-10-CM | POA: Diagnosis not present

## 2015-01-19 DIAGNOSIS — I10 Essential (primary) hypertension: Secondary | ICD-10-CM | POA: Diagnosis not present

## 2015-01-22 DIAGNOSIS — Z4800 Encounter for change or removal of nonsurgical wound dressing: Secondary | ICD-10-CM | POA: Diagnosis not present

## 2015-01-22 DIAGNOSIS — S51812D Laceration without foreign body of left forearm, subsequent encounter: Secondary | ICD-10-CM | POA: Diagnosis not present

## 2015-01-22 DIAGNOSIS — Z4801 Encounter for change or removal of surgical wound dressing: Secondary | ICD-10-CM | POA: Diagnosis not present

## 2015-02-22 DIAGNOSIS — E039 Hypothyroidism, unspecified: Secondary | ICD-10-CM | POA: Diagnosis not present

## 2015-02-22 DIAGNOSIS — I10 Essential (primary) hypertension: Secondary | ICD-10-CM | POA: Diagnosis not present

## 2015-02-22 DIAGNOSIS — J42 Unspecified chronic bronchitis: Secondary | ICD-10-CM | POA: Diagnosis not present

## 2015-03-31 DIAGNOSIS — E039 Hypothyroidism, unspecified: Secondary | ICD-10-CM | POA: Diagnosis not present

## 2015-03-31 DIAGNOSIS — Z8659 Personal history of other mental and behavioral disorders: Secondary | ICD-10-CM | POA: Diagnosis not present

## 2015-03-31 DIAGNOSIS — Z1211 Encounter for screening for malignant neoplasm of colon: Secondary | ICD-10-CM | POA: Diagnosis not present

## 2015-03-31 DIAGNOSIS — I10 Essential (primary) hypertension: Secondary | ICD-10-CM | POA: Diagnosis not present

## 2015-03-31 DIAGNOSIS — K219 Gastro-esophageal reflux disease without esophagitis: Secondary | ICD-10-CM | POA: Diagnosis not present

## 2015-06-09 DIAGNOSIS — E039 Hypothyroidism, unspecified: Secondary | ICD-10-CM | POA: Diagnosis not present

## 2015-06-09 DIAGNOSIS — J42 Unspecified chronic bronchitis: Secondary | ICD-10-CM | POA: Diagnosis not present

## 2015-06-09 DIAGNOSIS — I10 Essential (primary) hypertension: Secondary | ICD-10-CM | POA: Diagnosis not present

## 2015-07-31 ENCOUNTER — Encounter (HOSPITAL_COMMUNITY): Payer: Self-pay | Admitting: Emergency Medicine

## 2015-07-31 ENCOUNTER — Emergency Department (HOSPITAL_COMMUNITY): Payer: Medicaid Other

## 2015-07-31 ENCOUNTER — Emergency Department (HOSPITAL_COMMUNITY)
Admission: EM | Admit: 2015-07-31 | Discharge: 2015-07-31 | Disposition: A | Discharge status: Home / Self Care | Payer: Medicaid Other | Attending: Emergency Medicine | Admitting: Emergency Medicine

## 2015-07-31 DIAGNOSIS — F319 Bipolar disorder, unspecified: Secondary | ICD-10-CM | POA: Insufficient documentation

## 2015-07-31 DIAGNOSIS — I10 Essential (primary) hypertension: Secondary | ICD-10-CM | POA: Insufficient documentation

## 2015-07-31 DIAGNOSIS — J441 Chronic obstructive pulmonary disease with (acute) exacerbation: Secondary | ICD-10-CM | POA: Insufficient documentation

## 2015-07-31 DIAGNOSIS — J45909 Unspecified asthma, uncomplicated: Secondary | ICD-10-CM | POA: Diagnosis not present

## 2015-07-31 DIAGNOSIS — Z79899 Other long term (current) drug therapy: Secondary | ICD-10-CM | POA: Diagnosis not present

## 2015-07-31 DIAGNOSIS — Z7982 Long term (current) use of aspirin: Secondary | ICD-10-CM | POA: Diagnosis not present

## 2015-07-31 DIAGNOSIS — R0602 Shortness of breath: Secondary | ICD-10-CM | POA: Diagnosis not present

## 2015-07-31 DIAGNOSIS — F17210 Nicotine dependence, cigarettes, uncomplicated: Secondary | ICD-10-CM | POA: Insufficient documentation

## 2015-07-31 HISTORY — DX: Disorder of thyroid, unspecified: E07.9

## 2015-07-31 MED ORDER — PREDNISONE 10 MG PO TABS: ORAL_TABLET | ORAL | Status: AC

## 2015-07-31 MED ORDER — ALBUTEROL (5 MG/ML) CONTINUOUS INHALATION SOLN
10.0000 mg/h | INHALATION_SOLUTION | Freq: Once | RESPIRATORY_TRACT | Status: AC
  Administered 2015-07-31: 10 mg/h via RESPIRATORY_TRACT
  Filled 2015-07-31: qty 20

## 2015-07-31 MED ORDER — DOXYCYCLINE HYCLATE 100 MG PO CAPS: 100.0000 mg | ORAL_CAPSULE | Freq: Two times a day (BID) | ORAL | Status: AC

## 2015-07-31 MED ORDER — PREDNISONE 50 MG PO TABS
60.0000 mg | ORAL_TABLET | Freq: Once | ORAL | Status: AC
  Administered 2015-07-31: 60 mg via ORAL
  Filled 2015-07-31: qty 1

## 2015-07-31 NOTE — Discharge Instructions (Signed)

## 2015-07-31 NOTE — ED Notes (Signed)
Patient complaining of cough and shortness of breath x 2 months. States she has seen PCP over one week ago and given prednisone.

## 2015-07-31 NOTE — ED Provider Notes (Signed)
CSN: [id number]     Arrival date & time 07/31/15  1301 History   First MD Initiated Contact with Patient 07/31/15 1314     Chief Complaint  Patient presents with  . Shortness of Breath  . Cough     (Consider location/radiation/quality/duration/timing/severity/associated sxs/prior Treatment) HPI   Margaret Sheppard is a 50 y.o. female who presents for evaluation of cough and shortness of breath, for 2 months. She continues to smoke and has been anxious smoking more than usual because her daughter is currently hospitalized with a very serious illness. She uses oxygen when necessary, but mostly 24/7. She has a ongoing cough productive of "brown sputum". She denies nausea, vomiting, chest pain, abdominal pain, weakness or dizziness. She feels a course of prednisone, one week ago. She uses nebulizer treatments at home. She has been able to take all of her medicines as prescribed, and has had no trouble eating. She is distraught about her daughter's medical condition. There are no other known modifying factors.    Past Medical History  Diagnosis Date  . Hypertension   . Bipolar 1 disorder (HCC)   . Asthma   . COPD (chronic obstructive pulmonary disease) (HCC)   . Thyroid disease    Past Surgical History  Procedure Laterality Date  . Tubal ligation    . Cesarean section     History reviewed. No pertinent family history. Social History  Substance Use Topics  . Smoking status: Current Every Day Smoker -- 0.50 packs/day    Types: Cigarettes  . Smokeless tobacco: None  . Alcohol Use: Yes     Comment: occ   OB History    No data available     Review of Systems  All other systems reviewed and are negative.     Allergies  Codeine; Lithium; Sulfa antibiotics; and Penicillins  Home Medications   Prior to Admission medications   Medication Sig Start Date End Date Taking? Authorizing Provider  albuterol (PROVENTIL HFA) 108 (90 BASE) MCG/ACT inhaler Inhale 2 puffs into the lungs  every 4 (four) hours as needed for wheezing or shortness of breath. For shortness of breath 06/03/14  Yes Estela Isaiah Blakes, MD  aspirin 325 MG tablet Take 325 mg by mouth every 6 (six) hours as needed for mild pain or headache.   Yes Historical Provider, MD  buPROPion (WELLBUTRIN SR) 150 MG 12 hr tablet Take 150 mg by mouth 2 (two) times daily.   Yes Historical Provider, MD  Cyanocobalamin (VITAMIN B-12) 5000 MCG SUBL Place 1-2 tablets under the tongue daily.    Yes Historical Provider, MD  Fluticasone-Salmeterol (ADVAIR DISKUS) 250-50 MCG/DOSE AEPB Inhale 1 puff into the lungs 2 (two) times daily. 07/22/15  Yes Historical Provider, MD  gabapentin (NEURONTIN) 400 MG capsule Take 800 mg by mouth daily.    Yes Historical Provider, MD  ipratropium-albuterol (DUONEB) 0.5-2.5 (3) MG/3ML SOLN Take 3 mLs by nebulization every 6 (six) hours as needed (shortness of breath).   Yes Historical Provider, MD  levothyroxine (SYNTHROID, LEVOTHROID) 100 MCG tablet Take 100 mcg by mouth daily before breakfast.   Yes Historical Provider, MD  lisinopril (PRINIVIL,ZESTRIL) 10 MG tablet Take 10 mg by mouth daily.   Yes Historical Provider, MD  Multiple Vitamin (MULTIVITAMIN WITH MINERALS) TABS tablet Take 1 tablet by mouth daily.   Yes Historical Provider, MD  omeprazole (PRILOSEC) 20 MG capsule Take 20 mg by mouth daily.   Yes Historical Provider, MD  tiotropium (SPIRIVA HANDIHALER) 18 MCG inhalation  capsule Place 1 capsule (18 mcg total) into inhaler and inhale daily. 06/03/14  Yes Estela Isaiah BlakesY Hernandez Acosta, MD  doxycycline (VIBRAMYCIN) 100 MG capsule Take 1 capsule (100 mg total) by mouth 2 (two) times daily. 07/31/15   Mancel BaleElliott Amirra Herling, MD  predniSONE (DELTASONE) 10 MG tablet Take q day 6,5,4,3,2,1 07/31/15   Mancel BaleElliott Ryian Lynde, MD   BP 142/80 mmHg  Pulse 120  Temp(Src) 98 F (36.7 C) (Oral)  Resp 20  Ht 5' (1.524 m)  Wt 124 lb (56.246 kg)  BMI 24.22 kg/m2  SpO2 90%  LMP 07/05/2010 Physical Exam  Constitutional:  She is oriented to person, place, and time. She appears well-developed.  She appears older than stated age.  HENT:  Head: Normocephalic and atraumatic.  Right Ear: External ear normal.  Left Ear: External ear normal.  Eyes: Conjunctivae and EOM are normal. Pupils are equal, round, and reactive to light.  Neck: Normal range of motion and phonation normal. Neck supple.  Cardiovascular: Normal rate, regular rhythm and normal heart sounds.   Pulmonary/Chest: Effort normal. She exhibits no bony tenderness.  Decreased air movement bilaterally, with a few end expiratory wheezes and scattered rhonchi  Abdominal: Soft. There is no tenderness.  Musculoskeletal: Normal range of motion.  Neurological: She is alert and oriented to person, place, and time. No cranial nerve deficit or sensory deficit. She exhibits normal muscle tone. Coordination normal.  Skin: Skin is warm, dry and intact.  Psychiatric: She has a normal mood and affect. Her behavior is normal. Judgment and thought content normal.  Nursing note and vitals reviewed.   ED Course  Procedures (including critical care time)  Medications  albuterol (PROVENTIL,VENTOLIN) solution continuous neb (10 mg/hr Nebulization Given 07/31/15 1359)  predniSONE (DELTASONE) tablet 60 mg (60 mg Oral Given 07/31/15 1348)    Patient Vitals for the past 24 hrs:  BP Temp Temp src Pulse Resp SpO2 Height Weight  07/31/15 1540 142/80 mmHg - - 120 20 90 % - -  07/31/15 1401 - - - - - 97 % - -  07/31/15 1309 137/75 mmHg 98 F (36.7 C) Oral 116 22 96 % 5' (1.524 m) 124 lb (56.246 kg)    4:06 PM Reevaluation with update and discussion. After initial assessment and treatment, an updated evaluation reveals patient is more comfortable and has improved air movement after the nebulizer treatment. Findings discussed with the patient, all questions were answered. Iwalani Templeton L    Labs Review Labs Reviewed - No data to display  Imaging Review Dg Chest 2  View  07/31/2015  CLINICAL DATA:  Two month history of cough and shortness of breath. EXAM: CHEST  2 VIEW COMPARISON:  06/01/2014 FINDINGS: Hyperexpansion is consistent with emphysema. The lungs are clear wiithout focal pneumonia, edema, pneumothorax or pleural effusion. Basilar interstitial scarring with micro nodularity is stable The cardio pericardial silhouette is enlarged. The visualized bony structures of the thorax are intact. Telemetry leads overlie the chest. IMPRESSION: Emphysema without acute cardiopulmonary findings. Electronically Signed   By: Kennith CenterEric  Mansell M.D.   On: 07/31/2015 15:29   I have personally reviewed and evaluated these images and lab results as part of my medical decision-making.   EKG Interpretation   Date/Time:  Saturday July 31 2015 13:21:14 EST Ventricular Rate:  111 PR Interval:    QRS Duration: 63 QT Interval:  205 QTC Calculation: 278 R Axis:   97 Text Interpretation:  Artifact Sinus tachycardia Low voltage, extremity  and precordial leads Abnrm T, probable ischemia,  anterolateral lds Since  last tracing rate faster Confirmed by [facility]  MD, Kolyn Rozario [phone]) on  07/31/2015 1:28:32 PM      MDM   Final diagnoses:  COPD exacerbation (HCC)    Evaluation is consistent with COPD exacerbation associated with tobacco abuse. Doubt pneumonia, ACS or PE.  Nursing Notes Reviewed/ Care Coordinated Applicable Imaging Reviewed Interpretation of Laboratory Data incorporated into ED treatment  The patient appears reasonably screened and/or stabilized for discharge and I doubt any other medical condition or other [facility] requiring further screening, evaluation, or treatment in the ED at this time prior to discharge.  Plan: Home Medications- prednisone taper, doxycycline; Home Treatments- stop smoking; return here if the recommended treatment, does not improve the symptoms; Recommended follow up- PCP when necessary     Mancel Bale, MD 07/31/15 [phone]

## 2016-03-21 LAB — BLOOD GAS, ARTERIAL
ACID-BASE EXCESS: 5 mmol/L — AB (ref 0.0–2.0)
BICARBONATE: 30.5 meq/L — AB (ref 20.0–24.0)
DRAWN BY: 213101
FIO2: 28
O2 Content: 2 L/min
O2 SAT: 87.7 %
TCO2: 27.9 mmol/L (ref 0–100)
pCO2 arterial: 58.6 mmHg (ref 35.0–45.0)
pH, Arterial: 7.337 — ABNORMAL LOW (ref 7.350–7.450)
pO2, Arterial: 57.4 mmHg — ABNORMAL LOW (ref 80.0–100.0)

## 2016-06-02 DIAGNOSIS — J42 Unspecified chronic bronchitis: Secondary | ICD-10-CM | POA: Diagnosis not present

## 2016-06-02 DIAGNOSIS — R05 Cough: Secondary | ICD-10-CM | POA: Diagnosis not present

## 2016-09-07 DIAGNOSIS — Z720 Tobacco use: Secondary | ICD-10-CM | POA: Diagnosis not present

## 2016-09-07 DIAGNOSIS — J441 Chronic obstructive pulmonary disease with (acute) exacerbation: Secondary | ICD-10-CM | POA: Diagnosis not present

## 2017-10-08 IMAGING — DX DG CHEST 2V
2 series · 2 of 2 positions shown · non-contrast
Comparison: 06/01/2014

CLINICAL DATA: Two month history of cough and shortness of breath.

EXAM:
CHEST  2 VIEW

[chest pa]
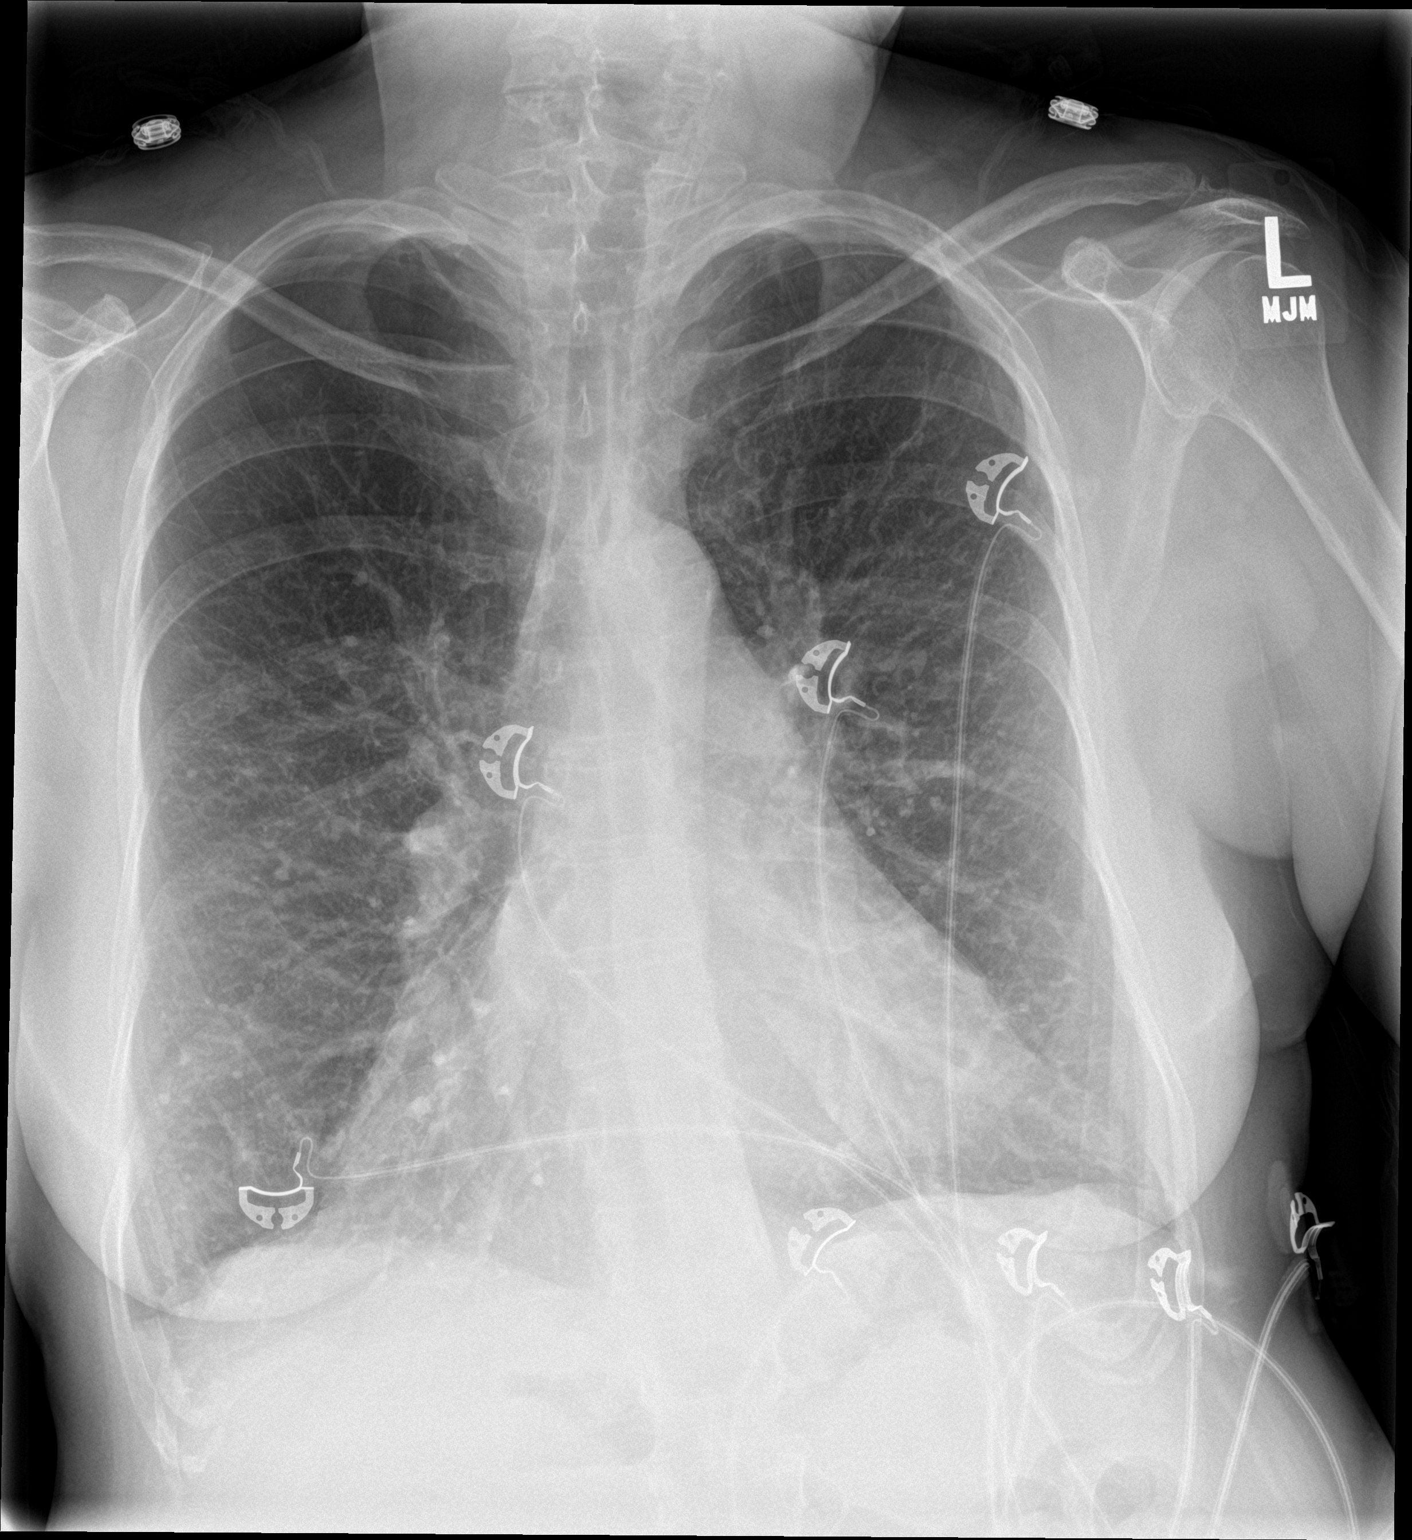

[chest lat]
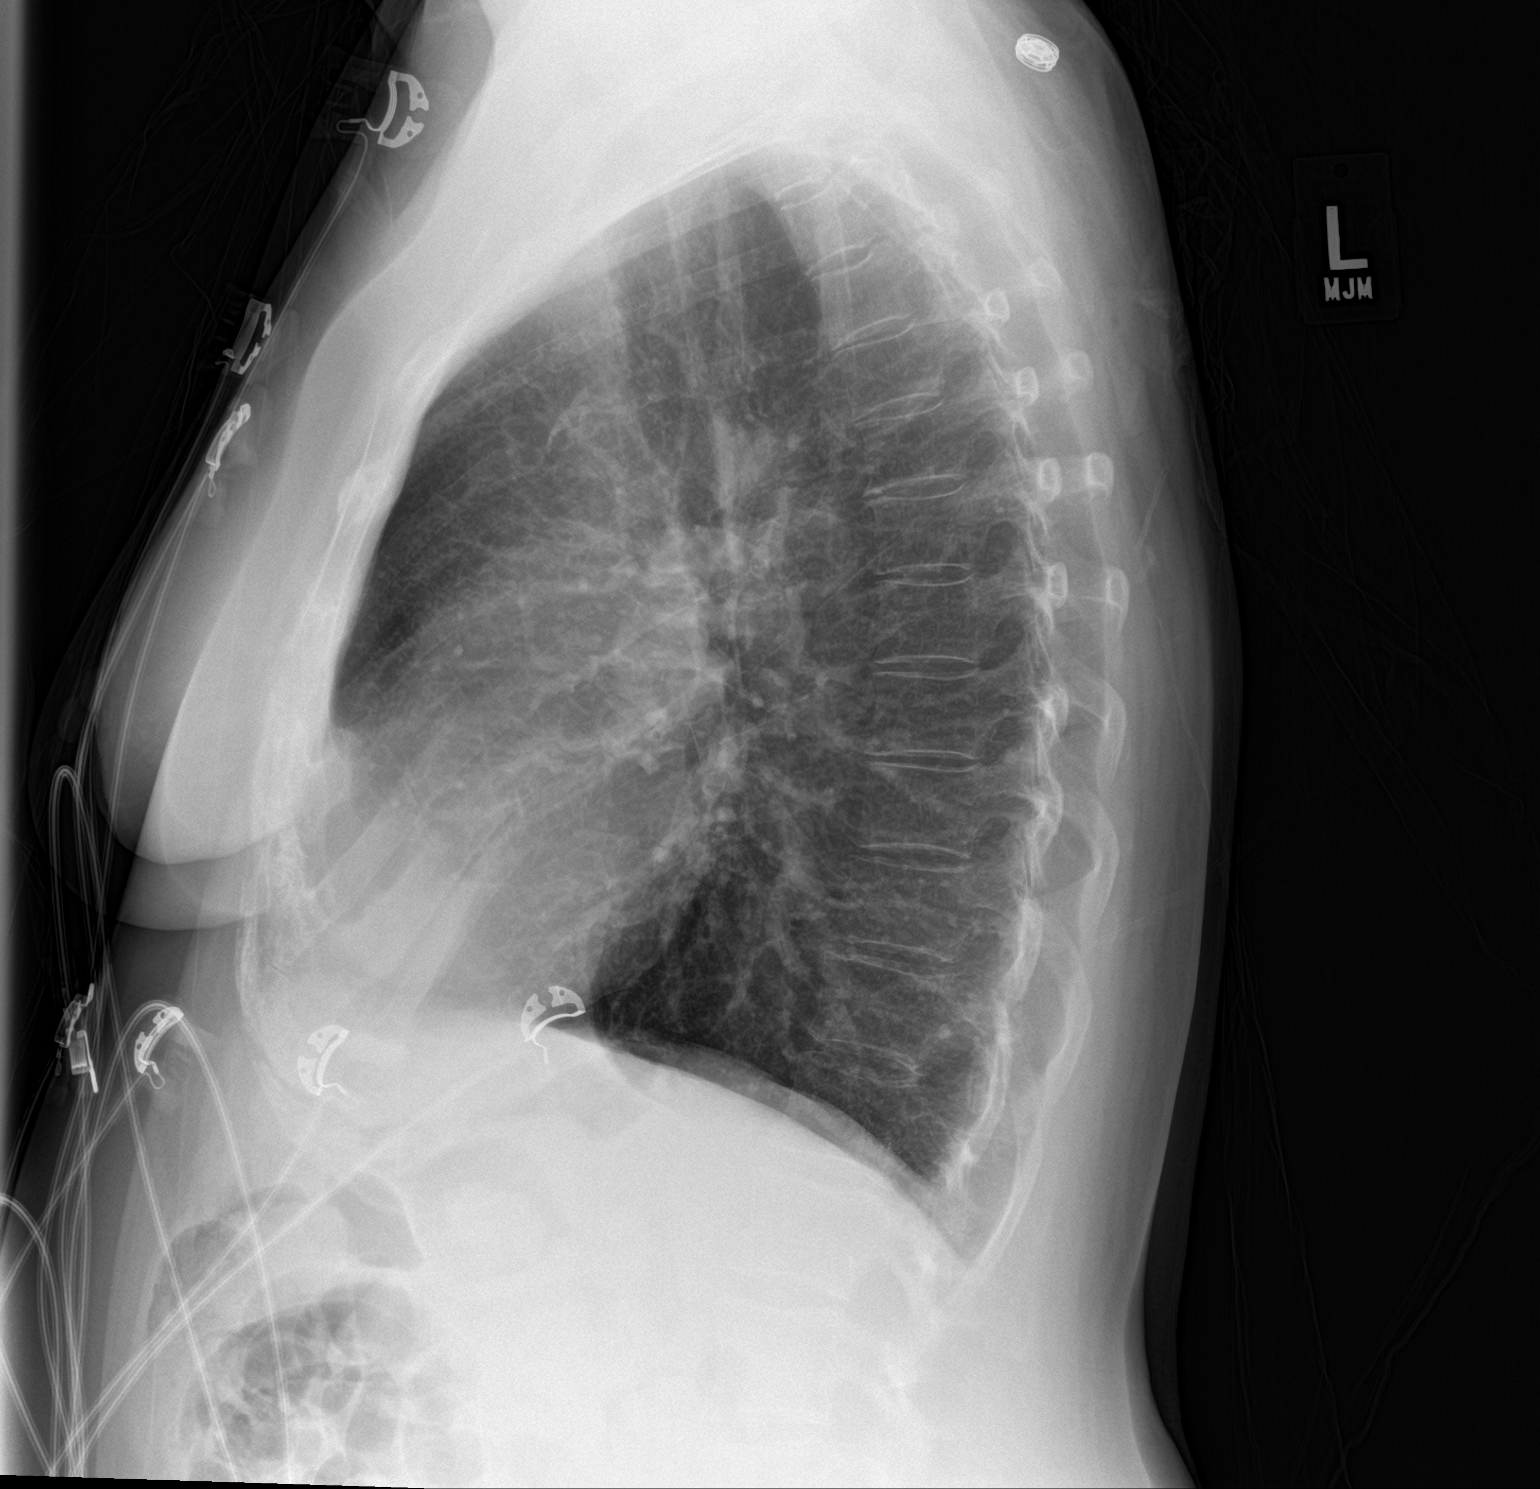

[2 of 2 positions shown; findings below may reference images not displayed]

FINDINGS: Hyperexpansion is consistent with emphysema. The lungs are clear
wiithout focal pneumonia, edema, pneumothorax or pleural effusion.
Basilar interstitial scarring with micro nodularity is stable The
cardio pericardial silhouette is enlarged. The visualized bony
structures of the thorax are intact. Telemetry leads overlie the
chest.
IMPRESSION: Emphysema without acute cardiopulmonary findings.

## 2018-01-20 DEATH — deceased
